# Patient Record
Sex: Female | Born: 1960 | Race: Black or African American | Hispanic: No | Marital: Married | Smoking: Never smoker
Health system: Southern US, Community
[De-identification: ages and names within clinical notes are randomized; demographics above are authoritative.]

## PROBLEM LIST (undated history)

## (undated) DIAGNOSIS — IMO0002 Reserved for concepts with insufficient information to code with codable children: Secondary | ICD-10-CM

## (undated) DIAGNOSIS — E785 Hyperlipidemia, unspecified: Secondary | ICD-10-CM

## (undated) DIAGNOSIS — I10 Essential (primary) hypertension: Secondary | ICD-10-CM

## (undated) DIAGNOSIS — E7849 Other hyperlipidemia: Secondary | ICD-10-CM

## (undated) DIAGNOSIS — D649 Anemia, unspecified: Secondary | ICD-10-CM

## (undated) DIAGNOSIS — G43909 Migraine, unspecified, not intractable, without status migrainosus: Secondary | ICD-10-CM

## (undated) DIAGNOSIS — E8881 Metabolic syndrome: Secondary | ICD-10-CM

## (undated) DIAGNOSIS — F609 Personality disorder, unspecified: Secondary | ICD-10-CM

## (undated) DIAGNOSIS — M1990 Unspecified osteoarthritis, unspecified site: Secondary | ICD-10-CM

## (undated) DIAGNOSIS — R251 Tremor, unspecified: Secondary | ICD-10-CM

## (undated) DIAGNOSIS — B009 Herpesviral infection, unspecified: Secondary | ICD-10-CM

## (undated) DIAGNOSIS — M797 Fibromyalgia: Secondary | ICD-10-CM

## (undated) DIAGNOSIS — Z1379 Encounter for other screening for genetic and chromosomal anomalies: Secondary | ICD-10-CM

## (undated) DIAGNOSIS — Z803 Family history of malignant neoplasm of breast: Secondary | ICD-10-CM

## (undated) DIAGNOSIS — Z026 Encounter for examination for insurance purposes: Secondary | ICD-10-CM

## (undated) DIAGNOSIS — K589 Irritable bowel syndrome without diarrhea: Secondary | ICD-10-CM

## (undated) DIAGNOSIS — F419 Anxiety disorder, unspecified: Secondary | ICD-10-CM

## (undated) HISTORY — DX: Unspecified osteoarthritis, unspecified site: M19.90

## (undated) HISTORY — DX: Anxiety disorder, unspecified: F41.9

## (undated) HISTORY — DX: Essential (primary) hypertension: I10

## (undated) HISTORY — DX: Anemia, unspecified: D64.9

## (undated) HISTORY — DX: Encounter for examination for insurance purposes: Z02.6

## (undated) HISTORY — DX: Metabolic syndrome: E88.810

## (undated) HISTORY — DX: Family history of malignant neoplasm of breast: Z80.3

## (undated) HISTORY — DX: Hyperlipidemia, unspecified: E78.5

## (undated) HISTORY — DX: Fibromyalgia: M79.7

## (undated) HISTORY — PX: OTHER SURGICAL HISTORY: SHX169

## (undated) HISTORY — DX: Encounter for other screening for genetic and chromosomal anomalies: Z13.79

## (undated) HISTORY — DX: Personality disorder, unspecified: F60.9

## (undated) HISTORY — DX: Migraine, unspecified, not intractable, without status migrainosus: G43.909

## (undated) HISTORY — PX: SHOULDER ARTHROSCOPY: SHX128

## (undated) HISTORY — DX: Irritable bowel syndrome, unspecified: K58.9

## (undated) HISTORY — DX: Metabolic syndrome: E88.81

## (undated) HISTORY — DX: Tremor, unspecified: R25.1

## (undated) HISTORY — DX: Reserved for concepts with insufficient information to code with codable children: IMO0002

## (undated) HISTORY — DX: Other hyperlipidemia: E78.49

## (undated) HISTORY — DX: Herpesviral infection, unspecified: B00.9

---

## 1994-09-16 HISTORY — PX: SHOULDER ARTHROSCOPY: SHX128

## 1998-05-31 ENCOUNTER — Inpatient Hospital Stay (HOSPITAL_COMMUNITY): Admission: RE | Admit: 1998-05-31 | Discharge: 1998-06-02 | Payer: Self-pay | Admitting: Gynecology

## 1998-11-07 ENCOUNTER — Other Ambulatory Visit: Admission: RE | Admit: 1998-11-07 | Discharge: 1998-11-07 | Payer: Self-pay | Admitting: Gynecology

## 1999-08-27 ENCOUNTER — Encounter: Admission: RE | Admit: 1999-08-27 | Discharge: 1999-08-27 | Payer: Self-pay | Admitting: Neurology

## 1999-08-27 ENCOUNTER — Encounter: Payer: Self-pay | Admitting: Neurology

## 1999-10-24 ENCOUNTER — Other Ambulatory Visit: Admission: RE | Admit: 1999-10-24 | Discharge: 1999-10-24 | Payer: Self-pay | Admitting: Gynecology

## 1999-12-06 ENCOUNTER — Ambulatory Visit (HOSPITAL_COMMUNITY): Admission: RE | Admit: 1999-12-06 | Discharge: 1999-12-06 | Payer: Self-pay | Admitting: Psychiatry

## 1999-12-10 ENCOUNTER — Ambulatory Visit (HOSPITAL_COMMUNITY): Admission: RE | Admit: 1999-12-10 | Discharge: 1999-12-10 | Payer: Self-pay | Admitting: Psychiatry

## 1999-12-17 ENCOUNTER — Ambulatory Visit (HOSPITAL_COMMUNITY): Admission: RE | Admit: 1999-12-17 | Discharge: 1999-12-17 | Payer: Self-pay | Admitting: Psychiatry

## 2000-01-16 ENCOUNTER — Ambulatory Visit (HOSPITAL_COMMUNITY): Admission: RE | Admit: 2000-01-16 | Discharge: 2000-01-16 | Payer: Self-pay | Admitting: Psychiatry

## 2000-01-28 ENCOUNTER — Ambulatory Visit (HOSPITAL_COMMUNITY): Admission: RE | Admit: 2000-01-28 | Discharge: 2000-01-28 | Payer: Self-pay | Admitting: Psychiatry

## 2000-02-05 ENCOUNTER — Ambulatory Visit (HOSPITAL_COMMUNITY): Admission: RE | Admit: 2000-02-05 | Discharge: 2000-02-05 | Payer: Self-pay | Admitting: Psychiatry

## 2000-02-12 ENCOUNTER — Ambulatory Visit (HOSPITAL_COMMUNITY): Admission: RE | Admit: 2000-02-12 | Discharge: 2000-02-12 | Payer: Self-pay | Admitting: Psychiatry

## 2000-02-18 ENCOUNTER — Ambulatory Visit (HOSPITAL_COMMUNITY): Admission: RE | Admit: 2000-02-18 | Discharge: 2000-02-18 | Payer: Self-pay | Admitting: Psychiatry

## 2000-02-25 ENCOUNTER — Inpatient Hospital Stay (HOSPITAL_COMMUNITY): Admission: AD | Admit: 2000-02-25 | Discharge: 2000-02-26 | Payer: Self-pay | Admitting: Psychiatry

## 2000-03-07 ENCOUNTER — Ambulatory Visit (HOSPITAL_COMMUNITY): Admission: RE | Admit: 2000-03-07 | Discharge: 2000-03-07 | Payer: Self-pay | Admitting: Psychiatry

## 2000-03-10 ENCOUNTER — Inpatient Hospital Stay (HOSPITAL_COMMUNITY): Admission: AD | Admit: 2000-03-10 | Discharge: 2000-03-15 | Payer: Self-pay | Admitting: *Deleted

## 2000-03-20 ENCOUNTER — Ambulatory Visit (HOSPITAL_COMMUNITY): Admission: RE | Admit: 2000-03-20 | Discharge: 2000-03-20 | Payer: Self-pay | Admitting: Psychiatry

## 2000-03-25 ENCOUNTER — Ambulatory Visit (HOSPITAL_COMMUNITY): Admission: RE | Admit: 2000-03-25 | Discharge: 2000-03-25 | Payer: Self-pay | Admitting: Psychiatry

## 2000-03-28 ENCOUNTER — Ambulatory Visit (HOSPITAL_COMMUNITY): Admission: RE | Admit: 2000-03-28 | Discharge: 2000-03-28 | Payer: Self-pay | Admitting: Psychiatry

## 2000-03-31 ENCOUNTER — Ambulatory Visit (HOSPITAL_COMMUNITY): Admission: RE | Admit: 2000-03-31 | Discharge: 2000-03-31 | Payer: Self-pay | Admitting: Psychiatry

## 2000-04-03 ENCOUNTER — Ambulatory Visit (HOSPITAL_COMMUNITY): Admission: RE | Admit: 2000-04-03 | Discharge: 2000-04-03 | Payer: Self-pay | Admitting: Psychiatry

## 2000-04-07 ENCOUNTER — Ambulatory Visit (HOSPITAL_COMMUNITY): Admission: RE | Admit: 2000-04-07 | Discharge: 2000-04-07 | Payer: Self-pay | Admitting: Psychiatry

## 2000-04-09 ENCOUNTER — Ambulatory Visit (HOSPITAL_COMMUNITY): Admission: RE | Admit: 2000-04-09 | Discharge: 2000-04-09 | Payer: Self-pay | Admitting: Psychiatry

## 2000-04-11 ENCOUNTER — Ambulatory Visit (HOSPITAL_COMMUNITY): Admission: RE | Admit: 2000-04-11 | Discharge: 2000-04-11 | Payer: Self-pay | Admitting: Psychiatry

## 2000-04-15 ENCOUNTER — Ambulatory Visit (HOSPITAL_COMMUNITY): Admission: RE | Admit: 2000-04-15 | Discharge: 2000-04-15 | Payer: Self-pay | Admitting: Psychiatry

## 2000-05-08 ENCOUNTER — Ambulatory Visit (HOSPITAL_COMMUNITY): Admission: RE | Admit: 2000-05-08 | Discharge: 2000-05-08 | Payer: Self-pay | Admitting: Psychiatry

## 2000-05-13 ENCOUNTER — Ambulatory Visit (HOSPITAL_COMMUNITY): Admission: RE | Admit: 2000-05-13 | Discharge: 2000-05-13 | Payer: Self-pay | Admitting: Psychiatry

## 2000-05-16 ENCOUNTER — Ambulatory Visit (HOSPITAL_COMMUNITY): Admission: RE | Admit: 2000-05-16 | Discharge: 2000-05-16 | Payer: Self-pay | Admitting: Psychiatry

## 2000-05-20 ENCOUNTER — Ambulatory Visit (HOSPITAL_COMMUNITY): Admission: RE | Admit: 2000-05-20 | Discharge: 2000-05-20 | Payer: Self-pay | Admitting: Psychiatry

## 2000-05-22 ENCOUNTER — Ambulatory Visit (HOSPITAL_COMMUNITY): Admission: RE | Admit: 2000-05-22 | Discharge: 2000-05-22 | Payer: Self-pay | Admitting: Psychiatry

## 2000-06-03 ENCOUNTER — Ambulatory Visit (HOSPITAL_COMMUNITY): Admission: RE | Admit: 2000-06-03 | Discharge: 2000-06-03 | Payer: Self-pay | Admitting: Psychiatry

## 2000-06-06 ENCOUNTER — Ambulatory Visit (HOSPITAL_COMMUNITY): Admission: RE | Admit: 2000-06-06 | Discharge: 2000-06-06 | Payer: Self-pay | Admitting: Psychiatry

## 2000-06-10 ENCOUNTER — Ambulatory Visit (HOSPITAL_COMMUNITY): Admission: RE | Admit: 2000-06-10 | Discharge: 2000-06-10 | Payer: Self-pay | Admitting: Psychiatry

## 2000-06-25 ENCOUNTER — Ambulatory Visit (HOSPITAL_COMMUNITY): Admission: RE | Admit: 2000-06-25 | Discharge: 2000-06-25 | Payer: Self-pay | Admitting: Psychiatry

## 2000-07-01 ENCOUNTER — Ambulatory Visit (HOSPITAL_COMMUNITY): Admission: RE | Admit: 2000-07-01 | Discharge: 2000-07-01 | Payer: Self-pay | Admitting: Psychiatry

## 2000-07-03 ENCOUNTER — Ambulatory Visit (HOSPITAL_COMMUNITY): Admission: RE | Admit: 2000-07-03 | Discharge: 2000-07-03 | Payer: Self-pay | Admitting: Psychiatry

## 2000-07-08 ENCOUNTER — Ambulatory Visit (HOSPITAL_COMMUNITY): Admission: RE | Admit: 2000-07-08 | Discharge: 2000-07-08 | Payer: Self-pay | Admitting: Psychiatry

## 2000-07-10 ENCOUNTER — Ambulatory Visit (HOSPITAL_COMMUNITY): Admission: RE | Admit: 2000-07-10 | Discharge: 2000-07-10 | Payer: Self-pay | Admitting: Psychiatry

## 2000-07-15 ENCOUNTER — Ambulatory Visit (HOSPITAL_COMMUNITY): Admission: RE | Admit: 2000-07-15 | Discharge: 2000-07-15 | Payer: Self-pay | Admitting: Psychiatry

## 2000-07-17 ENCOUNTER — Ambulatory Visit (HOSPITAL_COMMUNITY): Admission: RE | Admit: 2000-07-17 | Discharge: 2000-07-17 | Payer: Self-pay | Admitting: Psychiatry

## 2000-07-21 ENCOUNTER — Ambulatory Visit (HOSPITAL_COMMUNITY): Admission: RE | Admit: 2000-07-21 | Discharge: 2000-07-21 | Payer: Self-pay | Admitting: Psychiatry

## 2000-07-24 ENCOUNTER — Ambulatory Visit (HOSPITAL_COMMUNITY): Admission: RE | Admit: 2000-07-24 | Discharge: 2000-07-24 | Payer: Self-pay | Admitting: Psychiatry

## 2000-08-04 ENCOUNTER — Ambulatory Visit (HOSPITAL_COMMUNITY): Admission: RE | Admit: 2000-08-04 | Discharge: 2000-08-04 | Payer: Self-pay | Admitting: Psychiatry

## 2000-08-11 ENCOUNTER — Ambulatory Visit (HOSPITAL_COMMUNITY): Admission: RE | Admit: 2000-08-11 | Discharge: 2000-08-11 | Payer: Self-pay | Admitting: Psychiatry

## 2000-08-27 ENCOUNTER — Encounter: Payer: Self-pay | Admitting: Family Medicine

## 2000-08-27 ENCOUNTER — Encounter: Admission: RE | Admit: 2000-08-27 | Discharge: 2000-08-27 | Payer: Self-pay | Admitting: Family Medicine

## 2000-09-02 ENCOUNTER — Ambulatory Visit (HOSPITAL_COMMUNITY): Admission: RE | Admit: 2000-09-02 | Discharge: 2000-09-02 | Payer: Self-pay | Admitting: Psychiatry

## 2000-09-04 ENCOUNTER — Ambulatory Visit (HOSPITAL_COMMUNITY): Admission: RE | Admit: 2000-09-04 | Discharge: 2000-09-04 | Payer: Self-pay | Admitting: Psychiatry

## 2000-09-04 ENCOUNTER — Encounter: Admission: RE | Admit: 2000-09-04 | Discharge: 2000-09-04 | Payer: Self-pay | Admitting: Psychiatry

## 2000-09-12 ENCOUNTER — Ambulatory Visit (HOSPITAL_COMMUNITY): Admission: RE | Admit: 2000-09-12 | Discharge: 2000-09-12 | Payer: Self-pay | Admitting: Psychiatry

## 2000-09-16 HISTORY — PX: BREAST BIOPSY: SHX20

## 2000-09-17 ENCOUNTER — Ambulatory Visit (HOSPITAL_COMMUNITY): Admission: RE | Admit: 2000-09-17 | Discharge: 2000-09-17 | Payer: Self-pay | Admitting: Psychiatry

## 2000-10-09 ENCOUNTER — Encounter: Admission: RE | Admit: 2000-10-09 | Discharge: 2000-10-09 | Payer: Self-pay | Admitting: Psychiatry

## 2000-10-09 ENCOUNTER — Ambulatory Visit (HOSPITAL_COMMUNITY): Admission: RE | Admit: 2000-10-09 | Discharge: 2000-10-09 | Payer: Self-pay | Admitting: Psychiatry

## 2000-10-14 ENCOUNTER — Ambulatory Visit (HOSPITAL_COMMUNITY): Admission: RE | Admit: 2000-10-14 | Discharge: 2000-10-14 | Payer: Self-pay | Admitting: Psychiatry

## 2000-10-14 ENCOUNTER — Encounter: Admission: RE | Admit: 2000-10-14 | Discharge: 2000-10-14 | Payer: Self-pay | Admitting: Psychiatry

## 2000-10-27 ENCOUNTER — Other Ambulatory Visit: Admission: RE | Admit: 2000-10-27 | Discharge: 2000-10-27 | Payer: Self-pay | Admitting: Gynecology

## 2000-11-13 ENCOUNTER — Encounter: Payer: Self-pay | Admitting: Gynecology

## 2000-11-13 ENCOUNTER — Ambulatory Visit (HOSPITAL_COMMUNITY): Admission: RE | Admit: 2000-11-13 | Discharge: 2000-11-13 | Payer: Self-pay | Admitting: Gynecology

## 2001-03-24 ENCOUNTER — Other Ambulatory Visit (HOSPITAL_COMMUNITY): Admission: RE | Admit: 2001-03-24 | Discharge: 2001-03-31 | Payer: Self-pay | Admitting: Psychiatry

## 2001-04-09 ENCOUNTER — Encounter: Admission: RE | Admit: 2001-04-09 | Discharge: 2001-04-09 | Payer: Self-pay | Admitting: Gynecology

## 2001-04-09 ENCOUNTER — Encounter: Payer: Self-pay | Admitting: Gynecology

## 2001-05-05 ENCOUNTER — Ambulatory Visit (HOSPITAL_COMMUNITY): Admission: RE | Admit: 2001-05-05 | Discharge: 2001-05-05 | Payer: Self-pay | Admitting: Psychiatry

## 2001-05-15 ENCOUNTER — Encounter: Admission: RE | Admit: 2001-05-15 | Discharge: 2001-05-15 | Payer: Self-pay | Admitting: Orthopedic Surgery

## 2001-05-15 ENCOUNTER — Encounter: Payer: Self-pay | Admitting: Orthopedic Surgery

## 2001-05-20 ENCOUNTER — Other Ambulatory Visit (HOSPITAL_COMMUNITY): Admission: RE | Admit: 2001-05-20 | Discharge: 2001-05-29 | Payer: Self-pay | Admitting: Psychiatry

## 2001-06-16 ENCOUNTER — Encounter: Admission: RE | Admit: 2001-06-16 | Discharge: 2001-09-14 | Payer: Self-pay | Admitting: Specialist

## 2001-06-24 ENCOUNTER — Encounter (INDEPENDENT_AMBULATORY_CARE_PROVIDER_SITE_OTHER): Payer: Self-pay | Admitting: *Deleted

## 2001-06-24 ENCOUNTER — Ambulatory Visit (HOSPITAL_BASED_OUTPATIENT_CLINIC_OR_DEPARTMENT_OTHER): Admission: RE | Admit: 2001-06-24 | Discharge: 2001-06-24 | Payer: Self-pay | Admitting: Surgery

## 2001-07-08 ENCOUNTER — Encounter: Admission: RE | Admit: 2001-07-08 | Discharge: 2001-07-08 | Payer: Self-pay | Admitting: Psychiatry

## 2001-07-10 ENCOUNTER — Encounter: Admission: RE | Admit: 2001-07-10 | Discharge: 2001-07-10 | Payer: Self-pay | Admitting: Psychiatry

## 2001-07-16 ENCOUNTER — Encounter: Admission: RE | Admit: 2001-07-16 | Discharge: 2001-07-16 | Payer: Self-pay | Admitting: Psychiatry

## 2001-07-20 ENCOUNTER — Encounter: Admission: RE | Admit: 2001-07-20 | Discharge: 2001-07-20 | Payer: Self-pay | Admitting: Psychiatry

## 2001-07-23 ENCOUNTER — Encounter: Admission: RE | Admit: 2001-07-23 | Discharge: 2001-07-23 | Payer: Self-pay | Admitting: Psychiatry

## 2001-07-27 ENCOUNTER — Encounter: Admission: RE | Admit: 2001-07-27 | Discharge: 2001-07-27 | Payer: Self-pay | Admitting: Psychiatry

## 2001-07-29 ENCOUNTER — Encounter: Admission: RE | Admit: 2001-07-29 | Discharge: 2001-07-29 | Payer: Self-pay | Admitting: Psychiatry

## 2001-08-03 ENCOUNTER — Encounter: Admission: RE | Admit: 2001-08-03 | Discharge: 2001-08-03 | Payer: Self-pay | Admitting: Psychiatry

## 2001-08-07 ENCOUNTER — Encounter: Admission: RE | Admit: 2001-08-07 | Discharge: 2001-08-07 | Payer: Self-pay | Admitting: Psychiatry

## 2001-08-10 ENCOUNTER — Encounter: Admission: RE | Admit: 2001-08-10 | Discharge: 2001-08-10 | Payer: Self-pay | Admitting: Psychiatry

## 2001-08-17 ENCOUNTER — Encounter: Admission: RE | Admit: 2001-08-17 | Discharge: 2001-08-17 | Payer: Self-pay | Admitting: Psychiatry

## 2001-08-24 ENCOUNTER — Encounter: Admission: RE | Admit: 2001-08-24 | Discharge: 2001-08-24 | Payer: Self-pay | Admitting: Psychiatry

## 2001-08-27 ENCOUNTER — Encounter: Admission: RE | Admit: 2001-08-27 | Discharge: 2001-08-27 | Payer: Self-pay | Admitting: Psychiatry

## 2001-08-31 ENCOUNTER — Encounter: Admission: RE | Admit: 2001-08-31 | Discharge: 2001-08-31 | Payer: Self-pay | Admitting: Psychiatry

## 2001-09-07 ENCOUNTER — Encounter: Admission: RE | Admit: 2001-09-07 | Discharge: 2001-09-07 | Payer: Self-pay | Admitting: Psychiatry

## 2001-09-18 ENCOUNTER — Encounter: Admission: RE | Admit: 2001-09-18 | Discharge: 2001-09-18 | Payer: Self-pay | Admitting: Psychiatry

## 2001-09-28 ENCOUNTER — Encounter: Admission: RE | Admit: 2001-09-28 | Discharge: 2001-09-28 | Payer: Self-pay | Admitting: Psychiatry

## 2001-10-01 ENCOUNTER — Encounter: Admission: RE | Admit: 2001-10-01 | Discharge: 2001-10-01 | Payer: Self-pay | Admitting: Psychiatry

## 2001-10-05 ENCOUNTER — Encounter: Admission: RE | Admit: 2001-10-05 | Discharge: 2001-10-05 | Payer: Self-pay | Admitting: Psychiatry

## 2001-10-12 ENCOUNTER — Encounter: Admission: RE | Admit: 2001-10-12 | Discharge: 2001-10-12 | Payer: Self-pay | Admitting: Psychiatry

## 2001-10-15 ENCOUNTER — Encounter: Admission: RE | Admit: 2001-10-15 | Discharge: 2001-10-15 | Payer: Self-pay | Admitting: Psychiatry

## 2001-10-19 ENCOUNTER — Encounter: Admission: RE | Admit: 2001-10-19 | Discharge: 2001-10-19 | Payer: Self-pay | Admitting: Psychiatry

## 2001-10-21 ENCOUNTER — Encounter: Admission: RE | Admit: 2001-10-21 | Discharge: 2001-10-21 | Payer: Self-pay | Admitting: Psychiatry

## 2001-10-22 ENCOUNTER — Other Ambulatory Visit (HOSPITAL_COMMUNITY): Admission: RE | Admit: 2001-10-22 | Discharge: 2001-11-05 | Payer: Self-pay | Admitting: Psychiatry

## 2001-10-29 ENCOUNTER — Other Ambulatory Visit: Admission: RE | Admit: 2001-10-29 | Discharge: 2001-10-29 | Payer: Self-pay | Admitting: Gynecology

## 2001-11-09 ENCOUNTER — Encounter: Admission: RE | Admit: 2001-11-09 | Discharge: 2001-11-09 | Payer: Self-pay | Admitting: Psychiatry

## 2001-11-16 ENCOUNTER — Encounter: Admission: RE | Admit: 2001-11-16 | Discharge: 2001-11-16 | Payer: Self-pay | Admitting: Psychiatry

## 2002-04-12 ENCOUNTER — Encounter: Admission: RE | Admit: 2002-04-12 | Discharge: 2002-04-12 | Payer: Self-pay | Admitting: Surgery

## 2002-04-12 ENCOUNTER — Encounter: Payer: Self-pay | Admitting: Surgery

## 2002-09-16 HISTORY — PX: BREAST EXCISIONAL BIOPSY: SUR124

## 2002-11-01 ENCOUNTER — Other Ambulatory Visit: Admission: RE | Admit: 2002-11-01 | Discharge: 2002-11-01 | Payer: Self-pay | Admitting: Gynecology

## 2003-04-14 ENCOUNTER — Encounter: Admission: RE | Admit: 2003-04-14 | Discharge: 2003-04-14 | Payer: Self-pay | Admitting: Surgery

## 2003-04-14 ENCOUNTER — Encounter: Payer: Self-pay | Admitting: Surgery

## 2003-11-16 ENCOUNTER — Other Ambulatory Visit: Admission: RE | Admit: 2003-11-16 | Discharge: 2003-11-16 | Payer: Self-pay | Admitting: Gynecology

## 2004-04-17 ENCOUNTER — Encounter: Admission: RE | Admit: 2004-04-17 | Discharge: 2004-04-17 | Payer: Self-pay | Admitting: Surgery

## 2004-11-21 ENCOUNTER — Other Ambulatory Visit: Admission: RE | Admit: 2004-11-21 | Discharge: 2004-11-21 | Payer: Self-pay | Admitting: Gynecology

## 2004-11-28 ENCOUNTER — Encounter: Admission: RE | Admit: 2004-11-28 | Discharge: 2004-11-28 | Payer: Self-pay | Admitting: Gynecology

## 2004-12-18 ENCOUNTER — Encounter: Admission: RE | Admit: 2004-12-18 | Discharge: 2004-12-18 | Payer: Self-pay | Admitting: Orthopedic Surgery

## 2005-04-19 ENCOUNTER — Encounter: Admission: RE | Admit: 2005-04-19 | Discharge: 2005-04-19 | Payer: Self-pay | Admitting: Surgery

## 2005-11-26 ENCOUNTER — Other Ambulatory Visit: Admission: RE | Admit: 2005-11-26 | Discharge: 2005-11-26 | Payer: Self-pay | Admitting: Gynecology

## 2006-02-27 ENCOUNTER — Encounter: Admission: RE | Admit: 2006-02-27 | Discharge: 2006-02-27 | Payer: Self-pay | Admitting: Orthopedic Surgery

## 2006-03-18 ENCOUNTER — Encounter: Admission: RE | Admit: 2006-03-18 | Discharge: 2006-03-18 | Payer: Self-pay | Admitting: Orthopedic Surgery

## 2006-05-14 ENCOUNTER — Encounter: Admission: RE | Admit: 2006-05-14 | Discharge: 2006-05-14 | Payer: Self-pay | Admitting: Orthopedic Surgery

## 2006-08-01 ENCOUNTER — Ambulatory Visit (HOSPITAL_COMMUNITY): Admission: RE | Admit: 2006-08-01 | Discharge: 2006-08-01 | Payer: Self-pay | Admitting: Surgery

## 2007-01-14 ENCOUNTER — Other Ambulatory Visit: Admission: RE | Admit: 2007-01-14 | Discharge: 2007-01-14 | Payer: Self-pay | Admitting: Gynecology

## 2007-08-04 ENCOUNTER — Ambulatory Visit (HOSPITAL_COMMUNITY): Admission: RE | Admit: 2007-08-04 | Discharge: 2007-08-04 | Payer: Self-pay | Admitting: Gynecology

## 2007-12-14 ENCOUNTER — Encounter: Admission: RE | Admit: 2007-12-14 | Discharge: 2007-12-14 | Payer: Self-pay | Admitting: Surgery

## 2008-08-04 ENCOUNTER — Ambulatory Visit (HOSPITAL_COMMUNITY): Admission: RE | Admit: 2008-08-04 | Discharge: 2008-08-04 | Payer: Self-pay | Admitting: Surgery

## 2009-01-31 ENCOUNTER — Encounter: Admission: RE | Admit: 2009-01-31 | Discharge: 2009-01-31 | Payer: Self-pay | Admitting: Orthopedic Surgery

## 2009-06-19 ENCOUNTER — Encounter: Admission: RE | Admit: 2009-06-19 | Discharge: 2009-06-19 | Payer: Self-pay | Admitting: Orthopedic Surgery

## 2009-08-08 ENCOUNTER — Ambulatory Visit (HOSPITAL_COMMUNITY): Admission: RE | Admit: 2009-08-08 | Discharge: 2009-08-08 | Payer: Self-pay | Admitting: Gynecology

## 2009-08-17 ENCOUNTER — Encounter: Admission: RE | Admit: 2009-08-17 | Discharge: 2009-08-17 | Payer: Self-pay | Admitting: Gynecology

## 2009-12-15 HISTORY — PX: BACK SURGERY: SHX140

## 2009-12-26 ENCOUNTER — Ambulatory Visit (HOSPITAL_COMMUNITY): Admission: RE | Admit: 2009-12-26 | Discharge: 2009-12-27 | Payer: Self-pay | Admitting: Neurosurgery

## 2010-01-30 ENCOUNTER — Encounter: Admission: RE | Admit: 2010-01-30 | Discharge: 2010-01-30 | Payer: Self-pay | Admitting: Surgery

## 2010-08-13 ENCOUNTER — Ambulatory Visit (HOSPITAL_COMMUNITY): Admission: RE | Admit: 2010-08-13 | Discharge: 2010-08-13 | Payer: Self-pay | Admitting: Surgery

## 2010-10-07 ENCOUNTER — Encounter: Payer: Self-pay | Admitting: Surgery

## 2010-10-07 ENCOUNTER — Encounter: Payer: Self-pay | Admitting: Gynecology

## 2010-12-05 LAB — URINALYSIS, ROUTINE W REFLEX MICROSCOPIC
Bilirubin Urine: NEGATIVE
Glucose, UA: NEGATIVE mg/dL
Hgb urine dipstick: NEGATIVE
Ketones, ur: NEGATIVE mg/dL
Nitrite: NEGATIVE
Protein, ur: NEGATIVE mg/dL
Specific Gravity, Urine: 1.025 (ref 1.005–1.030)
Urobilinogen, UA: 0.2 mg/dL (ref 0.0–1.0)
pH: 6.5 (ref 5.0–8.0)

## 2010-12-05 LAB — BASIC METABOLIC PANEL
BUN: 14 mg/dL (ref 6–23)
CO2: 25 mEq/L (ref 19–32)
Calcium: 9.1 mg/dL (ref 8.4–10.5)
Chloride: 107 mEq/L (ref 96–112)
Creatinine, Ser: 0.84 mg/dL (ref 0.4–1.2)
GFR calc Af Amer: >60 mL/min (ref >60)
GFR calc non Af Amer: >60 mL/min (ref >60)
Glucose, Bld: 96 mg/dL (ref 70–99)
Potassium: 3.5 mEq/L (ref 3.5–5.1)
Sodium: 136 mEq/L (ref 135–145)

## 2010-12-05 LAB — CBC
HCT: 38.2 % (ref 36.0–46.0)
Hemoglobin: 12.6 g/dL (ref 12.0–15.0)
MCHC: 32.9 g/dL (ref 30.0–36.0)
MCV: 90.3 fL (ref 78.0–100.0)
Platelets: 286 10*3/uL (ref 150–400)
RBC: 4.23 MIL/uL (ref 3.87–5.11)
RDW: 13.2 % (ref 11.5–15.5)
WBC: 7 10*3/uL (ref 4.0–10.5)

## 2010-12-05 LAB — PROTIME-INR
INR: 1 (ref 0.00–1.49)
Prothrombin Time: 13.1 seconds (ref 11.6–15.2)

## 2010-12-05 LAB — APTT: aPTT: 29 seconds (ref 24–37)

## 2010-12-05 LAB — SURGICAL PCR SCREEN
MRSA, PCR: NEGATIVE
Staphylococcus aureus: POSITIVE — AB

## 2010-12-05 LAB — PREGNANCY, URINE: Preg Test, Ur: NEGATIVE
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
## 2011-02-01 NOTE — Discharge Summary (Signed)
[facility]  Patient:    Dana Nichols, Dana Nichols                MRN: [id number] Adm. Date:  [id number] Disc. Date: 02/26/00 Attending:  Otilio Saber Dictator:   Eduard Roux, NP                           Discharge Summary  IDENTIFYING DATA:  Dana Nichols is married African-American female voluntarily admitted secondary to depression with suicidal ideation as a consequence to ongoing chronic pain disorder.  HISTORY OF PRESENT ILLNESS:  Patient currently is an outpatient client of Dana Nichols and has been treated there for depression secondary to ongoing pain condition related to an on-the-job, work-related injury.  She states she has been having ongoing conflicts with [employer].  She has had trouble getting her medication approved and she states that her pain has been increasing.  She has felt progressively like she is a burden on her family and feels also like she is doomed and will have to eventually kill herself in order to escape the pain.  She is very hopeless and helpless.  She is contracting for safety on the unit.  She still is verbalizing some suicidal ideation.  PAST PSYCHIATRIC HISTORY:  She is a Radio producer. She has had no previous inpatient treatment.  She has seen outpatient therapists in the past, Dana Nichols and Dana Nichols, and is recently a client of Dana Nichols, who saw her on February 25, 2000, where she expressed suicidal ideation and it was felt she should come inpatient for stabilization.  She is currently on Paxil 20 mg q.d. and Trazodone 50-100 mg q.h.s. p.r.n. for insomnia.  Patient sees Dr. Madelon Lips as her orthopedic surgeon and Dr. Marcy Panning as her primary care Dana Nichols, a Dr. Honor Nichols performs acupuncture on her, a Dr. Clifton Nichols is her orthopedist, and Dr. Sandria Nichols is her neurologist.  Current medical problems include right rotator cuff injury with  three subsequent surgical interventions that have left her with a right flacid hand with a gross tremor.  She also had a hysterectomy in 1999. She has a history of hypertension.  Current medications:  Paxil 20 mg q.d., Soma 1 tablet t.i.d. 350 mg, Caffrey:  Vioxx 25 mg q.d., OxyContin 10 mg t.i.d., Norvasc 5 mg q.d., Lamictal 25 mg q.h.s., Neurontin 300 mg t.i.d. and orphenadrine 100 mg twice a day.  DRUG ALLERGIES:  CODEINE.  ADMITTING MEDICATIONS:  Patient was resumed on her home medicines of Soma 350 mg t.i.d., Norvasc 5 mg q.d., Orphenadrine 100 mg t.i.d., Vioxx 25 mg q.d., Paxil 20 mg q.d., OxyContin 10 mg t.i.d., Neurontin 300 mg t.i.d., Trazodone 150 mg q.h.s.  MENTAL STATUS EXAMINATION:  Patient is a casually dressed African-American female.  She is very cooperative and pleasant.  Her speech is normal rate and tone and relevant.  Mood is depressed, affect is somewhat anxious.  Her thought processes are positive for suicidal ideation.  She contracts for safety.  She is negative for homicidal ideation.  Her thoughts are coherent and logical.  There is no evidence of psychoses.  Her cognitive function is intact, she is alert and oriented x 4 with limited insight and judgment.  ADMITTING DIAGNOSES: Axis I:    Major depression, recurrent, severe. Axis II:   Deferred. Axis III:  Hypertension, chronic pain secondary to right shoulder injury. Axis IV:   Severe, related  to medical problems and chronic pain. Axis V:    Current global assessment of function 40, highest past year 70.  LABORATORY DATA:  No laboratory data was performed.  HOSPITAL COURSE:  The patient was admitted on a voluntary basis to [facility] for stabilization of her depression.  She was resumed on her home medications of Soma 350 mg t.i.d., Norvasc 5 mg q.d., orphenadrine 100 mg b.i.d., Vioxx 25 mg q.d., Paxil 20 mg q.d., OxyContin 10 mg t.i.d., Neurontin 300 mg t.i.d. and Trazodone 150 mg.   Patient continued to express some suicidal ideation but only as it related to her chronic pain.  It was felt in conjunction with patient that addressing her chronic pain would be the first step for stabilization for her.  Case management assisted in locating an inpatient chronic pain treatment, and ultimately orders were written to transfer to [facility].  It was felt that inpatient pain management would be appropriate and would help to insure Dana Nichols safety.  Ms. Gilles was in agreement to this plan.  CONDITION AT TRANSFER:  Patient was transferred to [facility].  She was contracting for safety.  Her mood was somewhat better as she felt that there might be some hope regarding her chronic pain.  She was denying any active suicidal impulses. As noted in the HPI, she was not homicidal and there was never any evidence of psychotic symptoms.  DISPOSITION:  Patient was transferred to [facility] on February 26, 2000.  FOLLOW UP:  Patient will follow up with [facility].  DISCHARGE MEDICATIONS:  Patient was discharged to [facility] on current medication list of Soma 350 mg t.i.d., Norvasc 5 mg q.d., orphenadrine 100 mg b.i.d., Vioxx 25 mg q.d., Paxil 20 mg q.d., OxyContin 10 mg t.i.d., Neurontin 300 mg t.i.d., Trazodone 150 mg q.h.s.  FINAL DIAGNOSIS: Axis I:    Major depression, recurrent, severe. Axis II:   Deferred. Axis II:   Hypertension, chronic pain secondary to right shoulder injury. Axis IV:   Severe, related to medical problems. Axis V:    Current global assessment of function is 45, highest past year 70. DD:  03/04/00 TD:  03/05/00 Job: [id number]
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
## 2011-02-01 NOTE — Op Note (Signed)
[place]  Patient:    Dana Nichols, Dana Nichols Visit Number: [id number] MRN: [id number]          Service Type: DSU Location: [facility] Attending Physician:  Charlton Haws Dictated by:   Currie Paris, M.D. Proc. Date: 06/24/01 Admit Date:  06/24/2001   CC:         Desma Maxim, M.D.  Gretta Cool, M.D.   Operative Report  CCS# [id number]  PREOPERATIVE DIAGNOSIS:  Right breast mass, probable chronic abscess or infected cyst.  POSTOPERATIVE DIAGNOSIS:  Right breast mass, probable chronic abscess or infected cyst.  OPERATION PERFORMED:  SURGEON:  Currie Paris, M.D.  ASSISTANT:  ANESTHESIA:  General LMA.  INDICATIONS FOR PROCEDURE:  The patient is a 50 year old woman with a mass in the right breast which has appeared to be somewhat infected, perhaps a chronic subareolar abscess versus a cyst that had become infected.  She had been treated with antibiotics but had a residual mass although not particularly tender was fairly discrete.  DESCRIPTION OF PROCEDURE:  The patient was brought to the operating room and after satisfactory general anesthesia had been obtained, the patient was prepped and draped.  I made a curvilinear incision along the medial aspect of the areolar margin and the mass was palpable readily just beneath the skin.  I started coming around it, but entered into it, got some purulent material out which was cultured.  I went a little further out and lateral and a little further around it and then excised it from its medial connections and superiorly then inferiorly and rotated it up and out of the breast itself and then finally disconnected it from the base of the nipple where it seemed to originate.  Once this was done, bleeders were electrocoagulated.  The area was infiltrated with 1% Xylocaine to help with postoperative analgesia.  The skin was closed loosely with some 4-0 Monocryl and then Penrose  drain placed. Sterile dressings were applied. The patient tolerated the procedure well. There were no operative complications.  All counts were correct. Dictated by:   Currie Paris, M.D. Attending Physician:  Charlton Haws DD:  06/24/01 TD:  06/24/01 Job: [phone]
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
## 2011-07-09 ENCOUNTER — Other Ambulatory Visit (HOSPITAL_COMMUNITY): Payer: Self-pay | Admitting: Gynecology

## 2011-07-09 DIAGNOSIS — Z1231 Encounter for screening mammogram for malignant neoplasm of breast: Secondary | ICD-10-CM

## 2011-08-15 ENCOUNTER — Ambulatory Visit (HOSPITAL_COMMUNITY)
Admission: RE | Admit: 2011-08-15 | Discharge: 2011-08-15 | Disposition: A | Discharge status: Home / Self Care | Payer: Federal, State, Local not specified - PPO | Source: Ambulatory Visit | Attending: Gynecology | Admitting: Gynecology

## 2011-08-15 DIAGNOSIS — Z1231 Encounter for screening mammogram for malignant neoplasm of breast: Secondary | ICD-10-CM | POA: Insufficient documentation

## 2011-09-19 ENCOUNTER — Other Ambulatory Visit: Payer: Self-pay | Admitting: Gynecology

## 2012-04-21 ENCOUNTER — Encounter (INDEPENDENT_AMBULATORY_CARE_PROVIDER_SITE_OTHER): Payer: Self-pay | Admitting: Surgery

## 2012-04-21 ENCOUNTER — Ambulatory Visit (INDEPENDENT_AMBULATORY_CARE_PROVIDER_SITE_OTHER): Payer: Federal, State, Local not specified - PPO | Admitting: Surgery

## 2012-04-21 VITALS — BP 124/84 | HR 96 | Temp 97.0°F | Resp 16 | Ht 66.0 in | Wt 192.0 lb

## 2012-04-21 DIAGNOSIS — N6019 Diffuse cystic mastopathy of unspecified breast: Secondary | ICD-10-CM

## 2012-04-21 DIAGNOSIS — N63 Unspecified lump in unspecified breast: Secondary | ICD-10-CM

## 2012-04-21 NOTE — Patient Instructions (Addendum)
We will arrange a new mammogram and ultrasound to evaluate the areas in each breast that feel like a tender lump to you

## 2012-04-21 NOTE — Progress Notes (Signed)
Dana Nichols DOB: 16-Jun-1961 MRN: [id number]                                                                                      DATE: 04/21/2012  PCP: Lupita Raider, MD Referring Provider: Gretta Cool, MD  IMPRESSION:  Bilateral fibrocystic changes with nodularity upper outer quadrants that are tender. No definite dominant mass. History of dense breast tissue on prior mammograms and cysts on previous aspirations.  PLAN:  Will obtain fresh mammogram and ultrasound and then make decisions.                 CC:  Chief Complaint  Patient presents with  . Breast Mass    bilateral    HPI:  Dana Nichols is a 51 y.o.  female who presents for evaluation of Bilateral breast masses. She has noted these over the last month and they're tender. She notes only when she is sitting up or leaning forward and can feel it when she's lying down. She points to the right upper-outer quadrant and left upper outer quadrant as the areas. I saw him back in 2011 with dominant cysts. Those apparently resolved. Her last mammogram was in November and was negative although dense breast tissue is noted.  PMH:  has a past medical history of Anemia; Arthritis; Fibromyalgia; and Hypertension.  PSH:   has past surgical history that includes Shoulder arthroscopy (05/22/95) and Back surgery (12/2009).  ALLERGIES:   Allergies  Allergen Reactions  . Codeine     Not even sure - mother told pt she was allergic     MEDICATIONS: Current outpatient prescriptions:amLODipine (NORVASC) 10 MG tablet, Take 10 mg by mouth Daily., Disp: , Rfl: ;  celecoxib (CELEBREX) 200 MG capsule, Take 200 mg by mouth 2 (two) times daily., Disp: , Rfl: ;  DULoxetine (CYMBALTA) 30 MG capsule, Take 30 mg by mouth at bedtime and may repeat dose one time if needed., Disp: , Rfl: ;  fluticasone (CUTIVATE) 0.05 % cream, Apply 0.05 % topically as needed., Disp: , Rfl:  gabapentin (NEURONTIN) 600 MG tablet, Take 600 mg by mouth 3 (three)  times daily., Disp: , Rfl: ;  topiramate (TOPAMAX) 25 MG tablet, Take 25 mg by mouth 3 (three) times daily., Disp: , Rfl: ;  valACYclovir (VALTREX) 1000 MG tablet, Take 1 g by mouth 2 (two) times daily., Disp: , Rfl:   ROS: She has filled out our 12 point review of systems and it is negative Except for problems chills sore throat high discomfort leg swelling recent constipation nausea arthritis pains headaches fainting weakness and rash.Marland Kitchen EXAM:   Vital signs:BP 124/84  Pulse 96  Temp 97 F (36.1 C) (Temporal)  Resp 16  Ht 5\' 6"  (1.676 m)  Wt 192 lb (87.091 kg)  BMI 30.99 kg/m2 Gen.: Patient alert oriented healthy-appearing, NAD. Breasts: These are symmetric. She is quite tender especially upper outer quadrant bilaterally but also centrally. There is some nodularity consistent with fibrocystic change noted in the upper outer quadrants more prominently. I don't appreciate a definite dominant mass but she is tender and somewhat difficult to examine. The skin nipple and areolar  areas are normal to inspection. Lymphatics: No axillary lymphadenopathy noted Extremities:She has a limited range of motion of the right shoulder as well as a very prominent tremor of the right hand  DATA REVIEWED:  Mild office notes were reviewed as well as the mammogram and ultrasound reports from the past.    Nekayla Heider J 04/21/2012  CC: Gretta Cool, MD, Lupita Raider, MD

## 2012-04-27 ENCOUNTER — Ambulatory Visit
Admission: RE | Admit: 2012-04-27 | Discharge: 2012-04-27 | Disposition: A | Discharge status: Home / Self Care | Payer: Federal, State, Local not specified - PPO | Source: Ambulatory Visit | Attending: Surgery | Admitting: Surgery

## 2012-04-27 ENCOUNTER — Telehealth (INDEPENDENT_AMBULATORY_CARE_PROVIDER_SITE_OTHER): Payer: Self-pay | Admitting: General Surgery

## 2012-04-27 DIAGNOSIS — N63 Unspecified lump in unspecified breast: Secondary | ICD-10-CM

## 2012-04-27 DIAGNOSIS — N6019 Diffuse cystic mastopathy of unspecified breast: Secondary | ICD-10-CM

## 2012-04-27 NOTE — Telephone Encounter (Signed)
Patient aware mammogram/ultrasound benign. She will call as needed.

## 2012-04-27 NOTE — Telephone Encounter (Signed)
Message copied by Liliana Cline on Mon Apr 27, 2012 10:39 AM ------      Message from: Currie Paris      Created: Mon Apr 27, 2012 10:09 AM       Let her know that there is nothing suspicious in either breast, just cysts

## 2012-06-02 HISTORY — PX: NM MYOVIEW LTD: HXRAD82

## 2012-08-19 ENCOUNTER — Telehealth (INDEPENDENT_AMBULATORY_CARE_PROVIDER_SITE_OTHER): Payer: Self-pay | Admitting: General Surgery

## 2012-08-19 NOTE — Telephone Encounter (Signed)
Pt was seen in August for breast pain and had mammogram an u/s/ normal per pt/ She has been experiencing increased pain in outer quadrant of both breasts for several days, aleve not helping. She would like advice for pain management please/gy/ [phone]

## 2012-08-20 NOTE — Telephone Encounter (Signed)
If she is still having pain, would be best that she be re-examined before making any decisions

## 2012-08-20 NOTE — Telephone Encounter (Signed)
Patient made aware of recommendation and appt made for next Friday.

## 2012-08-28 ENCOUNTER — Encounter (INDEPENDENT_AMBULATORY_CARE_PROVIDER_SITE_OTHER): Payer: Self-pay | Admitting: Surgery

## 2012-08-28 ENCOUNTER — Ambulatory Visit (INDEPENDENT_AMBULATORY_CARE_PROVIDER_SITE_OTHER): Payer: Federal, State, Local not specified - PPO | Admitting: Surgery

## 2012-08-28 VITALS — BP 134/72 | HR 84 | Temp 97.3°F | Resp 16 | Ht 66.0 in | Wt 191.0 lb

## 2012-08-28 DIAGNOSIS — N6019 Diffuse cystic mastopathy of unspecified breast: Secondary | ICD-10-CM

## 2012-08-28 NOTE — Patient Instructions (Signed)
Continue routine annual mammograms and follow ups with your primary care and GYN. See me again if any changes in your breaast occur

## 2012-08-28 NOTE — Progress Notes (Signed)
ZO:[id number] of bilateral breast pain with history of fibrocystic changes and dominant cysts  History of present illness: I saw this patient several months ago. She had some cysts found on ultrasound and was having bilateral pain primarily in the upper outer quadrants of each breast. She come back for recheck today. Her pain seems to be cyclical about once a month, but she hasn't had a period since her hysterectomy. Her ovaries are still present however. She's had no other changes in her breasts. There is no nipple areolar change nipple discharge or prominent lump that she can find.  Past history, family history are all in the electronic medical record, not redictated here.  Exam: Vital signs:BP 134/72  Pulse 84  Temp 97.3 F (36.3 C) (Temporal)  Resp 16  Ht 5\' 6"  (1.676 m)  Wt 191 lb (86.637 kg)  BMI 30.83 kg/m2 General: The patient alert oriented healthy-appearing Breasts: The breasts are symmetric in size and shape. They're tender throughout but most tender in the upper outer quadrants. This particular diffuse nodularity noted in the upper-outer quadrants whereas the medial half of each breast and softer. There is no dominant mass. The skin and nipple areas look normal.  Lymphatics: No axillary supraclavicular adenopathy is present.  Data reviewed: Mammogram and ultrasound report from August: DIGITAL DIAGNOSTIC BILATERAL MAMMOGRAM WITH CAD AND BILATERAL  BREAST ULTRASOUND:  Comparison: With priors  Findings: There is a dense fibroglandular pattern. Two obscured  masses are seen in the upper outer quadrant of the right breast.  There is no new suspicious mass or malignant-type  microcalcifications in either breast.  Mammographic images were processed with CAD.  On physical exam, I do not palpate a discrete mass in either  breast.  Ultrasound is performed, showing there are two simple cysts in the  right breast at 9 o'clock 5 cm from the nipple measuring 0.8 x 0.7  x 1.0 cm and 1.6  x 0.9 x 1.2 cm. There is no solid mass or  abnormal shadowing.  Sonographic evaluation of the area of of clinical concern in the 12  o'clock region of the left breast is negative.  IMPRESSION:  Right breast cysts. No evidence of malignancy.  RECOMMENDATION:  Screening mammogram in 1 year is recommended.  BI-RADS CATEGORY 2: Benign finding(s).  Original Report Authenticated By: Littie Deeds. Judyann Munson, M.D.  Impression: Fibrocystic changes with mastodynia, no evidence of malignancy Plan: I recommended that she continue having close followup with her primary care and GYN. She should have annual mammograms. She'll come back here if she develops any new symptoms or palpates a mass or has other problems.

## 2012-09-23 ENCOUNTER — Other Ambulatory Visit: Payer: Self-pay | Admitting: Gynecology

## 2013-04-05 ENCOUNTER — Other Ambulatory Visit: Payer: Self-pay | Admitting: Family Medicine

## 2013-04-05 DIAGNOSIS — N63 Unspecified lump in unspecified breast: Secondary | ICD-10-CM

## 2013-04-05 DIAGNOSIS — N644 Mastodynia: Secondary | ICD-10-CM

## 2013-04-16 ENCOUNTER — Other Ambulatory Visit: Payer: Federal, State, Local not specified - PPO

## 2013-04-30 ENCOUNTER — Other Ambulatory Visit: Payer: Federal, State, Local not specified - PPO

## 2013-05-11 ENCOUNTER — Ambulatory Visit
Admission: RE | Admit: 2013-05-11 | Discharge: 2013-05-11 | Disposition: A | Discharge status: Home / Self Care | Payer: Federal, State, Local not specified - PPO | Source: Ambulatory Visit | Attending: Family Medicine | Admitting: Family Medicine

## 2013-05-11 DIAGNOSIS — N63 Unspecified lump in unspecified breast: Secondary | ICD-10-CM

## 2013-05-11 DIAGNOSIS — N644 Mastodynia: Secondary | ICD-10-CM

## 2013-11-15 ENCOUNTER — Encounter: Payer: Self-pay | Admitting: Neurology

## 2013-11-15 ENCOUNTER — Ambulatory Visit (INDEPENDENT_AMBULATORY_CARE_PROVIDER_SITE_OTHER): Payer: Federal, State, Local not specified - PPO | Admitting: Neurology

## 2013-11-15 ENCOUNTER — Telehealth: Payer: Self-pay | Admitting: Neurology

## 2013-11-15 VITALS — BP 130/100 | HR 78 | Temp 98.3°F | Resp 20 | Ht 67.0 in | Wt 203.0 lb

## 2013-11-15 DIAGNOSIS — M531 Cervicobrachial syndrome: Secondary | ICD-10-CM

## 2013-11-15 DIAGNOSIS — IMO0002 Reserved for concepts with insufficient information to code with codable children: Secondary | ICD-10-CM

## 2013-11-15 DIAGNOSIS — G43709 Chronic migraine without aura, not intractable, without status migrainosus: Secondary | ICD-10-CM

## 2013-11-15 DIAGNOSIS — M5481 Occipital neuralgia: Secondary | ICD-10-CM

## 2013-11-15 NOTE — Progress Notes (Signed)
NEUROLOGY CONSULTATION NOTE  Dana Nichols MRN: [id number] DOB: 28-Feb-1961  Referring provider: Dr. Clelia Croft Primary care provider: Dr. Clelia Croft  Reason for consult:  headache  HISTORY OF PRESENT ILLNESS: Dana Nichols is a 53 year old right-handed female with history of fibrocystic breast disease, HSV 2, fibromyalgia, IBS, migraines, hyperlipidemia, and bipolar and borderline personality disorder who presents for migraines.  Records and images were personally reviewed where available.    Onset:  2 years ago Location:  Left suboccipital region, radiates down left side of neck Quality:  stabbing Intensity:  10/10 Aura:  no Associated symptoms:  Nausea, vomiting, photophobia, phonophobia Duration:  Constant but fluctuates in intensity, lasting 45 minutes Frequency:  Constant but fluctuates in intensity, daily Triggers/exacerbating factors:  none Relieving factors:  Applying cold compresses or pressure on the area Activity:  Lays down in dark quiet room  Past abortive therapy:  none Past preventative therapy:  Botox but only to the suboccipital and neck muscles, message therapy  Current abortive therapy:  Aleve (helps.  Takes daily).  Question of taking sumatriptan but not familiar to patient Current preventative therapy:  Topamax (PCP notes list 50mg  TID but our records say 25mg  TID).    Caffeine:  One cola daily Stress/depression:  yes Sleep hygiene:  poor History of migraine:  no Family history of migraine:  father  Current medications include:  Latuda, Cymbalta 30mg , gabapentin 600mg  TID.  She also has history of spasticity and weakness in left arm.  She had shoulder surgery in 1997.  Afterwards, she developed weakness in right arm, with flexion at the elbow.  At the same time, she developed tremor, bilaterally but worse in right arm, as well as involving the head.  She reports that tremor runs in her family.  Around the same time, she developed fainting spells.  She was told  that they were dissociated spells.  They seem to be triggered when experiencing severe pain.  Cardiac workup was normal.  Has been seen by several specialists for this, both cardiology and neurology, including at [facility].  She no longer drives.  She has not worked since 1999.  PAST MEDICAL HISTORY: Past Medical History  Diagnosis Date  . Anemia   . Arthritis   . Fibromyalgia   . Hypertension     PAST SURGICAL HISTORY: Past Surgical History  Procedure Laterality Date  . Shoulder arthroscopy  05/22/95  . Back surgery  12/2009    MEDICATIONS: Current Outpatient Prescriptions on File Prior to Visit  Medication Sig Dispense Refill  . amLODipine (NORVASC) 10 MG tablet Take 10 mg by mouth Daily.      . celecoxib (CELEBREX) 200 MG capsule Take 200 mg by mouth 2 (two) times daily.      Marland Kitchen doxycycline (VIBRAMYCIN) 100 MG capsule       . DULoxetine (CYMBALTA) 30 MG capsule Take 30 mg by mouth at bedtime and may repeat dose one time if needed.      . fluticasone (CUTIVATE) 0.05 % cream Apply 0.05 % topically as needed.      . gabapentin (NEURONTIN) 600 MG tablet Take 600 mg by mouth 3 (three) times daily.      Marland Kitchen topiramate (TOPAMAX) 25 MG tablet Take 25 mg by mouth 3 (three) times daily.      . valACYclovir (VALTREX) 1000 MG tablet Take 1 g by mouth 2 (two) times daily.       No current facility-administered medications on file prior to visit.  ALLERGIES: Allergies  Allergen Reactions  . Codeine     Not even sure - mother told pt she was allergic     FAMILY HISTORY: Family History  Problem Relation Age of Onset  . Diabetes Mother   . Hyperlipidemia Mother   . Hypertension Mother   . Diabetes Sister   . Hyperlipidemia Sister   . Diabetes Brother   . Hyperlipidemia Brother   . Hypertension Brother   . Diabetes Sister     SOCIAL HISTORY: History   Social History  . Marital Status: Married    Spouse Name: N/A    Number of Children: N/A  . Years of Education: N/A    Occupational History  . Not on file.   Social History Main Topics  . Smoking status: Never Smoker   . Smokeless tobacco: Never Used  . Alcohol Use: No  . Drug Use: No  . Sexual Activity: Not on file   Other Topics Concern  . Not on file   Social History Narrative  . No narrative on file    REVIEW OF SYSTEMS: Constitutional: No fevers, chills, or sweats, no generalized fatigue, change in appetite Eyes: No visual changes, double vision, eye pain Ear, nose and throat: No hearing loss, ear pain, nasal congestion, sore throat Cardiovascular: No chest pain, palpitations Respiratory:  No shortness of breath at rest or with exertion, wheezes GastrointestinaI: No nausea, vomiting, diarrhea, abdominal pain, fecal incontinence Genitourinary:  No dysuria, urinary retention or frequency Musculoskeletal:  No neck pain, back pain Integumentary: No rash, pruritus, skin lesions Neurological: as above Psychiatric: No depression, insomnia, anxiety Endocrine: No palpitations, fatigue, diaphoresis, mood swings, change in appetite, change in weight, increased thirst Hematologic/Lymphatic:  No anemia, purpura, petechiae. Allergic/Immunologic: no itchy/runny eyes, nasal congestion, recent allergic reactions, rashes  PHYSICAL EXAM: Filed Vitals:   11/15/13 0950  BP: 130/100  Pulse: 78  Temp: 98.3 F (36.8 C)  Resp: 20   General: No acute distress Head:  Normocephalic/atraumatic Neck: supple, no paraspinal tenderness, full range of motion Back: No paraspinal tenderness Heart: regular rate and rhythm Lungs: Clear to auscultation bilaterally. Vascular: No carotid bruits. Neurological Exam: Mental status: alert and oriented to person, place, and time, speech fluent and not dysarthric, language intact. Cranial nerves: CN I: not tested CN II: pupils equal, round and reactive to light, visual fields intact, fundi unremarkable. CN III, IV, VI:  full range of motion, no nystagmus, no  ptosis CN V: facial sensation intact CN VII: upper and lower face symmetric CN VIII: hearing intact CN IX, X: gag intact, uvula midline CN XI: sternocleidomastoid and trapezius muscles intact CN XII: tongue midline Bulk & Tone: normal, no fasciculations. Motor: 3+ right triceps and grip, otherwise 5/5.  Mild cogwheel rigidity in right wrist.  No bradykinesia.  Mixed tremor in right hand.  Intention tremor in left hand. Sensation: temperature and vibration intact. Deep Tendon Reflexes: 2+ throughout, toes down Finger to nose testing: No dysmetria.  Mixed tremor in right hand, intention tremor in left hand. Heel to shin: no dysmetria Gait: normal stance and stride.  Able to turn, walk on toes, heels and in tandem. Romberg negative.  IMPRESSION: 1.  Chronic migraine without aura, possibly triggered by occipital neuralgia and medication-overuse 2.  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  Tremor   PLAN: 1.  Will try left occipital nerve block. 2.  We will refer for biofeedback to address the daily headaches 3.  Other options to consider would be to increase the  topiramate or start propranolol for the headaches.  There are two separate doses of topamax provided.  Advised to find out how much topiramate she takes and to let us know.  4.  Limit pain relievers and caffeine to no more than 2 days out of the week and only take it when you have a severe headache.  Consider Excedrin too. 5.  FOllow up 4 weeks after injection.  Thank you for allowing me to take part in the care of this patient.  Shon MilletAdam Rockland Kotarski, DO  CC:  Lupita RaiderKimberlee Shaw, MD

## 2013-11-15 NOTE — Telephone Encounter (Signed)
Someone called, pt was on long distance call. She was seen by Dr. Everlena CooperJaffe today. Please call / Sherri

## 2013-11-15 NOTE — Patient Instructions (Signed)
1.  I would like to schedule you in for an occipital nerve block to see if that helps the the headache. 2.  We will refer you for biofeedback to address the daily headaches 3.  Other options to consider would be to increase the topiramate or start propranolol for the headaches.  Find out how much topiramate you take.  Side effects of propranolol (which is a blood pressure medication) include lowering heart rate, dizziness, lightheadedness and sleepiness. 4.  Limit pain relievers and caffeine to no more than 2 days out of the week and only take it when you have a severe headache.  Consider Excedrin too. 5.  FOllow up 4 weeks after injection.

## 2013-11-18 ENCOUNTER — Ambulatory Visit (INDEPENDENT_AMBULATORY_CARE_PROVIDER_SITE_OTHER): Payer: Federal, State, Local not specified - PPO | Admitting: Neurology

## 2013-11-18 DIAGNOSIS — M5481 Occipital neuralgia: Secondary | ICD-10-CM

## 2013-11-18 DIAGNOSIS — M531 Cervicobrachial syndrome: Secondary | ICD-10-CM

## 2013-11-18 MED ORDER — TRIAMCINOLONE ACETONIDE 40 MG/ML IJ SUSP
40.0000 mg | Freq: Once | INTRAMUSCULAR | Status: AC
  Administered 2013-11-18: 40 mg

## 2013-11-18 MED ORDER — BUPIVACAINE HCL 0.25 % IJ SOLN
3.0000 mL | Freq: Once | INTRAMUSCULAR | Status: AC
  Administered 2013-11-18: 3 mL

## 2013-11-18 NOTE — Progress Notes (Signed)
See procedure note.

## 2013-11-18 NOTE — Procedures (Signed)
Procedure: Occipital nerve block  Procedure risks, benefits and alternative treatments explained to patient and they agree to proceed. Written consent was obtained from the patient.  A concentration of 0.25% bupivacaine (3 ml) was mixed with 40 mg of Kenalog (1 ml). A 27 gauge needle was used for injection. The region of the left greater occipital nerve was located by palpation. The area was prepped with alcohol. A total of 2 cc of the above mixture was injected without difficulty. The patient tolerated the procedure well.    Trestan Vahle R. Everlena CooperJaffe, DO

## 2013-12-13 ENCOUNTER — Ambulatory Visit: Payer: Federal, State, Local not specified - PPO | Admitting: Neurology

## 2014-03-10 ENCOUNTER — Encounter: Payer: Self-pay | Admitting: *Deleted

## 2014-04-07 ENCOUNTER — Other Ambulatory Visit (HOSPITAL_COMMUNITY): Payer: Self-pay | Admitting: Family Medicine

## 2014-04-07 DIAGNOSIS — Z1231 Encounter for screening mammogram for malignant neoplasm of breast: Secondary | ICD-10-CM

## 2014-05-12 ENCOUNTER — Ambulatory Visit (HOSPITAL_COMMUNITY)
Admission: RE | Admit: 2014-05-12 | Discharge: 2014-05-12 | Disposition: A | Discharge status: Home / Self Care | Payer: Federal, State, Local not specified - PPO | Source: Ambulatory Visit | Attending: Family Medicine | Admitting: Family Medicine

## 2014-05-12 DIAGNOSIS — Z1231 Encounter for screening mammogram for malignant neoplasm of breast: Secondary | ICD-10-CM | POA: Diagnosis present

## 2015-03-24 ENCOUNTER — Other Ambulatory Visit: Payer: Self-pay

## 2015-03-24 DIAGNOSIS — I83813 Varicose veins of bilateral lower extremities with pain: Secondary | ICD-10-CM

## 2015-04-14 ENCOUNTER — Other Ambulatory Visit (HOSPITAL_COMMUNITY): Payer: Self-pay | Admitting: Family Medicine

## 2015-04-14 DIAGNOSIS — Z1231 Encounter for screening mammogram for malignant neoplasm of breast: Secondary | ICD-10-CM

## 2015-04-28 ENCOUNTER — Encounter (HOSPITAL_COMMUNITY): Payer: Federal, State, Local not specified - PPO

## 2015-04-28 ENCOUNTER — Encounter: Payer: Federal, State, Local not specified - PPO | Admitting: Vascular Surgery

## 2015-05-15 ENCOUNTER — Ambulatory Visit (HOSPITAL_COMMUNITY): Payer: Federal, State, Local not specified - PPO | Attending: Family Medicine

## 2015-07-05 ENCOUNTER — Other Ambulatory Visit: Payer: Self-pay

## 2015-07-05 DIAGNOSIS — Z1231 Encounter for screening mammogram for malignant neoplasm of breast: Secondary | ICD-10-CM

## 2015-07-17 ENCOUNTER — Ambulatory Visit
Admission: RE | Admit: 2015-07-17 | Discharge: 2015-07-17 | Disposition: A | Discharge status: Home / Self Care | Payer: Federal, State, Local not specified - PPO | Source: Ambulatory Visit

## 2015-07-17 DIAGNOSIS — Z1231 Encounter for screening mammogram for malignant neoplasm of breast: Secondary | ICD-10-CM

## 2015-07-19 ENCOUNTER — Ambulatory Visit: Payer: Federal, State, Local not specified - PPO

## 2015-08-02 ENCOUNTER — Ambulatory Visit (INDEPENDENT_AMBULATORY_CARE_PROVIDER_SITE_OTHER): Payer: Federal, State, Local not specified - PPO | Admitting: Diagnostic Neuroimaging

## 2015-08-02 ENCOUNTER — Encounter: Payer: Self-pay | Admitting: *Deleted

## 2015-08-02 VITALS — BP 133/95 | HR 86 | Ht 66.0 in | Wt 191.0 lb

## 2015-08-02 DIAGNOSIS — G43719 Chronic migraine without aura, intractable, without status migrainosus: Secondary | ICD-10-CM

## 2015-08-02 DIAGNOSIS — M5481 Occipital neuralgia: Secondary | ICD-10-CM | POA: Diagnosis not present

## 2015-08-02 NOTE — Patient Instructions (Signed)
Thank you for coming to see [place] at [facility]. I hope we have been able to provide you high quality care today.  You may receive a patient satisfaction survey over the next few weeks. We would appreciate your feedback and comments so that we may continue to improve ourselves and the health of our patients.  - continue current medications - follow up with Dr. Luetta Nutting   ~~~~~~~~~~~~~~~~~~~~~~~~~~~~~~~~~~~~~~~~~~~~~~~~~~~~~~~~~~~~~~~~~  DR. PENUMALLI'S GUIDE TO HAPPY AND HEALTHY LIVING These are some of my general health and wellness recommendations. Some of them may apply to you better than others. Please use common sense as you try these suggestions and feel free to ask me any questions.   ACTIVITY/FITNESS Mental, social, emotional and physical stimulation are very important for brain and body health. Try learning a new activity (arts, music, language, sports, games).  Keep moving your body to the best of your abilities. You can do this at home, inside or outside, the park, community center, gym or anywhere you like. Consider a physical therapist or personal trainer to get started. Consider the app Sworkit. Fitness trackers such as smart-watches, smart-phones or Fitbits can help as well.   NUTRITION Eat more plants: colorful vegetables, nuts, seeds and berries.  Eat less sugar, salt, preservatives and processed foods.  Avoid toxins such as cigarettes and alcohol.  Drink water when you are thirsty. Warm water with a slice of lemon is an excellent morning drink to start the day.  Consider these websites for more information The Nutrition Source (https://www.henry-hernandez.biz/) Precision Nutrition (WindowBlog.ch)   RELAXATION Consider practicing mindfulness meditation or other relaxation techniques such as deep breathing, prayer, yoga, tai chi, massage. See website mindful.org or the apps Headspace or Calm to help get  started.   SLEEP Try to get at least 7-8+ hours sleep per day. Regular exercise and reduced caffeine will help you sleep better. Practice good sleep hygeine techniques. See website sleep.org for more information.   PLANNING Prepare estate planning, living will, healthcare POA documents. Sometimes this is best planned with the help of an attorney. Theconversationproject.org and agingwithdignity.org are excellent resources.
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
## 2016-01-10 ENCOUNTER — Ambulatory Visit
Admission: RE | Admit: 2016-01-10 | Discharge: 2016-01-10 | Disposition: A | Discharge status: Home / Self Care | Payer: Federal, State, Local not specified - PPO | Source: Ambulatory Visit | Attending: Family Medicine | Admitting: Family Medicine

## 2016-01-10 ENCOUNTER — Other Ambulatory Visit: Payer: Self-pay | Admitting: Family Medicine

## 2016-01-10 DIAGNOSIS — N390 Urinary tract infection, site not specified: Secondary | ICD-10-CM | POA: Diagnosis not present

## 2016-01-10 DIAGNOSIS — R0602 Shortness of breath: Secondary | ICD-10-CM | POA: Diagnosis not present

## 2016-01-10 DIAGNOSIS — R042 Hemoptysis: Secondary | ICD-10-CM

## 2016-01-10 DIAGNOSIS — J329 Chronic sinusitis, unspecified: Secondary | ICD-10-CM | POA: Diagnosis not present

## 2016-01-10 DIAGNOSIS — R3911 Hesitancy of micturition: Secondary | ICD-10-CM | POA: Diagnosis not present

## 2016-02-15 DIAGNOSIS — Z86010 Personal history of colonic polyps: Secondary | ICD-10-CM | POA: Diagnosis not present

## 2016-02-15 DIAGNOSIS — K640 First degree hemorrhoids: Secondary | ICD-10-CM | POA: Diagnosis not present

## 2016-02-23 ENCOUNTER — Institutional Professional Consult (permissible substitution): Payer: Federal, State, Local not specified - PPO | Admitting: Diagnostic Neuroimaging

## 2016-03-14 DIAGNOSIS — M25511 Pain in right shoulder: Secondary | ICD-10-CM | POA: Diagnosis not present

## 2016-06-04 DIAGNOSIS — I10 Essential (primary) hypertension: Secondary | ICD-10-CM | POA: Diagnosis not present

## 2016-06-04 DIAGNOSIS — Z23 Encounter for immunization: Secondary | ICD-10-CM | POA: Diagnosis not present

## 2016-06-04 DIAGNOSIS — E782 Mixed hyperlipidemia: Secondary | ICD-10-CM | POA: Diagnosis not present

## 2016-06-12 DIAGNOSIS — R350 Frequency of micturition: Secondary | ICD-10-CM | POA: Diagnosis not present

## 2016-06-18 ENCOUNTER — Other Ambulatory Visit: Payer: Self-pay | Admitting: Family Medicine

## 2016-06-18 DIAGNOSIS — Z1231 Encounter for screening mammogram for malignant neoplasm of breast: Secondary | ICD-10-CM

## 2016-07-17 ENCOUNTER — Ambulatory Visit
Admission: RE | Admit: 2016-07-17 | Discharge: 2016-07-17 | Disposition: A | Discharge status: Home / Self Care | Payer: Federal, State, Local not specified - PPO | Source: Ambulatory Visit | Attending: Family Medicine | Admitting: Family Medicine

## 2016-07-17 DIAGNOSIS — Z1231 Encounter for screening mammogram for malignant neoplasm of breast: Secondary | ICD-10-CM

## 2016-08-22 DIAGNOSIS — M545 Low back pain: Secondary | ICD-10-CM | POA: Diagnosis not present

## 2016-08-29 DIAGNOSIS — M545 Low back pain: Secondary | ICD-10-CM | POA: Diagnosis not present

## 2016-09-05 DIAGNOSIS — M545 Low back pain: Secondary | ICD-10-CM | POA: Diagnosis not present

## 2016-09-05 DIAGNOSIS — M5116 Intervertebral disc disorders with radiculopathy, lumbar region: Secondary | ICD-10-CM | POA: Diagnosis not present

## 2016-09-19 DIAGNOSIS — M5116 Intervertebral disc disorders with radiculopathy, lumbar region: Secondary | ICD-10-CM | POA: Diagnosis not present

## 2016-09-19 DIAGNOSIS — M545 Low back pain: Secondary | ICD-10-CM | POA: Diagnosis not present

## 2016-11-20 DIAGNOSIS — Z5181 Encounter for therapeutic drug level monitoring: Secondary | ICD-10-CM | POA: Diagnosis not present

## 2016-11-20 DIAGNOSIS — F419 Anxiety disorder, unspecified: Secondary | ICD-10-CM | POA: Diagnosis not present

## 2016-11-20 DIAGNOSIS — F29 Unspecified psychosis not due to a substance or known physiological condition: Secondary | ICD-10-CM | POA: Diagnosis not present

## 2016-11-20 DIAGNOSIS — G8929 Other chronic pain: Secondary | ICD-10-CM | POA: Diagnosis not present

## 2016-11-20 DIAGNOSIS — F22 Delusional disorders: Secondary | ICD-10-CM | POA: Diagnosis not present

## 2016-11-20 DIAGNOSIS — R51 Headache: Secondary | ICD-10-CM | POA: Diagnosis not present

## 2016-11-20 DIAGNOSIS — S0181XD Laceration without foreign body of other part of head, subsequent encounter: Secondary | ICD-10-CM | POA: Diagnosis not present

## 2016-11-20 DIAGNOSIS — E669 Obesity, unspecified: Secondary | ICD-10-CM | POA: Diagnosis not present

## 2016-11-20 DIAGNOSIS — R45851 Suicidal ideations: Secondary | ICD-10-CM | POA: Diagnosis not present

## 2016-11-21 DIAGNOSIS — E669 Obesity, unspecified: Secondary | ICD-10-CM | POA: Diagnosis not present

## 2016-11-21 DIAGNOSIS — F319 Bipolar disorder, unspecified: Secondary | ICD-10-CM | POA: Diagnosis not present

## 2016-11-21 DIAGNOSIS — F419 Anxiety disorder, unspecified: Secondary | ICD-10-CM | POA: Diagnosis not present

## 2016-11-21 DIAGNOSIS — G8929 Other chronic pain: Secondary | ICD-10-CM | POA: Diagnosis not present

## 2016-11-21 DIAGNOSIS — I10 Essential (primary) hypertension: Secondary | ICD-10-CM | POA: Diagnosis not present

## 2016-11-21 DIAGNOSIS — F603 Borderline personality disorder: Secondary | ICD-10-CM | POA: Diagnosis not present

## 2016-11-21 DIAGNOSIS — G9050 Complex regional pain syndrome I, unspecified: Secondary | ICD-10-CM | POA: Diagnosis not present

## 2016-11-21 DIAGNOSIS — Z915 Personal history of self-harm: Secondary | ICD-10-CM | POA: Diagnosis not present

## 2016-11-21 DIAGNOSIS — M797 Fibromyalgia: Secondary | ICD-10-CM | POA: Diagnosis not present

## 2016-11-21 DIAGNOSIS — G90511 Complex regional pain syndrome I of right upper limb: Secondary | ICD-10-CM | POA: Diagnosis not present

## 2016-11-21 DIAGNOSIS — F22 Delusional disorders: Secondary | ICD-10-CM | POA: Diagnosis not present

## 2016-11-21 DIAGNOSIS — Z720 Tobacco use: Secondary | ICD-10-CM | POA: Diagnosis not present

## 2016-11-21 DIAGNOSIS — F4310 Post-traumatic stress disorder, unspecified: Secondary | ICD-10-CM | POA: Diagnosis not present

## 2016-11-21 DIAGNOSIS — K589 Irritable bowel syndrome without diarrhea: Secondary | ICD-10-CM | POA: Diagnosis not present

## 2016-11-21 DIAGNOSIS — F29 Unspecified psychosis not due to a substance or known physiological condition: Secondary | ICD-10-CM | POA: Diagnosis not present

## 2016-11-21 DIAGNOSIS — Z79899 Other long term (current) drug therapy: Secondary | ICD-10-CM | POA: Diagnosis not present

## 2016-11-21 DIAGNOSIS — R51 Headache: Secondary | ICD-10-CM | POA: Diagnosis not present

## 2016-11-21 DIAGNOSIS — R45851 Suicidal ideations: Secondary | ICD-10-CM | POA: Diagnosis not present

## 2016-11-21 DIAGNOSIS — J302 Other seasonal allergic rhinitis: Secondary | ICD-10-CM | POA: Diagnosis not present

## 2016-11-21 DIAGNOSIS — F209 Schizophrenia, unspecified: Secondary | ICD-10-CM | POA: Diagnosis not present

## 2016-11-21 DIAGNOSIS — B009 Herpesviral infection, unspecified: Secondary | ICD-10-CM | POA: Diagnosis not present

## 2016-11-21 DIAGNOSIS — S0181XD Laceration without foreign body of other part of head, subsequent encounter: Secondary | ICD-10-CM | POA: Diagnosis not present

## 2016-11-22 DIAGNOSIS — M797 Fibromyalgia: Secondary | ICD-10-CM | POA: Diagnosis not present

## 2016-11-22 DIAGNOSIS — G90511 Complex regional pain syndrome I of right upper limb: Secondary | ICD-10-CM | POA: Diagnosis not present

## 2016-11-22 DIAGNOSIS — F603 Borderline personality disorder: Secondary | ICD-10-CM | POA: Diagnosis not present

## 2016-11-23 DIAGNOSIS — F29 Unspecified psychosis not due to a substance or known physiological condition: Secondary | ICD-10-CM | POA: Diagnosis not present

## 2016-11-23 DIAGNOSIS — M797 Fibromyalgia: Secondary | ICD-10-CM | POA: Diagnosis not present

## 2016-11-23 DIAGNOSIS — F603 Borderline personality disorder: Secondary | ICD-10-CM | POA: Diagnosis not present

## 2016-11-23 DIAGNOSIS — G90511 Complex regional pain syndrome I of right upper limb: Secondary | ICD-10-CM | POA: Diagnosis not present

## 2016-11-24 DIAGNOSIS — G90511 Complex regional pain syndrome I of right upper limb: Secondary | ICD-10-CM | POA: Diagnosis not present

## 2016-11-24 DIAGNOSIS — F29 Unspecified psychosis not due to a substance or known physiological condition: Secondary | ICD-10-CM | POA: Diagnosis not present

## 2016-11-24 DIAGNOSIS — M797 Fibromyalgia: Secondary | ICD-10-CM | POA: Diagnosis not present

## 2016-11-24 DIAGNOSIS — F603 Borderline personality disorder: Secondary | ICD-10-CM | POA: Diagnosis not present

## 2016-11-25 DIAGNOSIS — F603 Borderline personality disorder: Secondary | ICD-10-CM | POA: Diagnosis not present

## 2016-11-25 DIAGNOSIS — G90511 Complex regional pain syndrome I of right upper limb: Secondary | ICD-10-CM | POA: Diagnosis not present

## 2016-11-25 DIAGNOSIS — F29 Unspecified psychosis not due to a substance or known physiological condition: Secondary | ICD-10-CM | POA: Diagnosis not present

## 2016-11-25 DIAGNOSIS — M797 Fibromyalgia: Secondary | ICD-10-CM | POA: Diagnosis not present

## 2016-11-27 DIAGNOSIS — R233 Spontaneous ecchymoses: Secondary | ICD-10-CM | POA: Diagnosis not present

## 2016-11-27 DIAGNOSIS — L309 Dermatitis, unspecified: Secondary | ICD-10-CM | POA: Diagnosis not present

## 2017-01-07 DIAGNOSIS — K589 Irritable bowel syndrome without diarrhea: Secondary | ICD-10-CM | POA: Diagnosis not present

## 2017-01-07 DIAGNOSIS — Z0000 Encounter for general adult medical examination without abnormal findings: Secondary | ICD-10-CM | POA: Diagnosis not present

## 2017-01-07 DIAGNOSIS — I10 Essential (primary) hypertension: Secondary | ICD-10-CM | POA: Diagnosis not present

## 2017-01-07 DIAGNOSIS — D649 Anemia, unspecified: Secondary | ICD-10-CM | POA: Diagnosis not present

## 2017-01-07 DIAGNOSIS — N760 Acute vaginitis: Secondary | ICD-10-CM | POA: Diagnosis not present

## 2017-02-12 ENCOUNTER — Other Ambulatory Visit: Payer: Self-pay | Admitting: Family Medicine

## 2017-02-12 ENCOUNTER — Ambulatory Visit
Admission: RE | Admit: 2017-02-12 | Discharge: 2017-02-12 | Disposition: A | Discharge status: Home / Self Care | Payer: Federal, State, Local not specified - PPO | Source: Ambulatory Visit | Attending: Family Medicine | Admitting: Family Medicine

## 2017-02-12 DIAGNOSIS — M533 Sacrococcygeal disorders, not elsewhere classified: Secondary | ICD-10-CM | POA: Diagnosis not present

## 2017-02-24 DIAGNOSIS — J069 Acute upper respiratory infection, unspecified: Secondary | ICD-10-CM | POA: Diagnosis not present

## 2017-02-28 DIAGNOSIS — L659 Nonscarring hair loss, unspecified: Secondary | ICD-10-CM | POA: Diagnosis not present

## 2017-02-28 DIAGNOSIS — G43909 Migraine, unspecified, not intractable, without status migrainosus: Secondary | ICD-10-CM | POA: Diagnosis not present

## 2017-06-05 ENCOUNTER — Other Ambulatory Visit: Payer: Self-pay | Admitting: Family Medicine

## 2017-06-05 DIAGNOSIS — Z1231 Encounter for screening mammogram for malignant neoplasm of breast: Secondary | ICD-10-CM

## 2017-07-21 ENCOUNTER — Ambulatory Visit
Admission: RE | Admit: 2017-07-21 | Discharge: 2017-07-21 | Disposition: A | Discharge status: Home / Self Care | Payer: Federal, State, Local not specified - PPO | Source: Ambulatory Visit | Attending: Family Medicine | Admitting: Family Medicine

## 2017-07-21 DIAGNOSIS — Z1231 Encounter for screening mammogram for malignant neoplasm of breast: Secondary | ICD-10-CM | POA: Diagnosis not present

## 2017-11-11 DIAGNOSIS — L720 Epidermal cyst: Secondary | ICD-10-CM | POA: Diagnosis not present

## 2017-11-11 DIAGNOSIS — R300 Dysuria: Secondary | ICD-10-CM | POA: Diagnosis not present

## 2018-01-23 ENCOUNTER — Inpatient Hospital Stay: Payer: Federal, State, Local not specified - PPO | Attending: Genetic Counselor | Admitting: Genetic Counselor

## 2018-01-23 ENCOUNTER — Encounter: Payer: Self-pay | Admitting: Genetic Counselor

## 2018-01-23 ENCOUNTER — Inpatient Hospital Stay: Payer: Federal, State, Local not specified - PPO

## 2018-01-23 DIAGNOSIS — Z803 Family history of malignant neoplasm of breast: Secondary | ICD-10-CM | POA: Diagnosis not present

## 2018-01-23 DIAGNOSIS — Z1379 Encounter for other screening for genetic and chromosomal anomalies: Secondary | ICD-10-CM
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
## 2018-01-26 DIAGNOSIS — Z0000 Encounter for general adult medical examination without abnormal findings: Secondary | ICD-10-CM | POA: Diagnosis not present

## 2018-01-26 DIAGNOSIS — I10 Essential (primary) hypertension: Secondary | ICD-10-CM | POA: Diagnosis not present

## 2018-01-26 DIAGNOSIS — E782 Mixed hyperlipidemia: Secondary | ICD-10-CM | POA: Diagnosis not present

## 2018-01-26 DIAGNOSIS — D649 Anemia, unspecified: Secondary | ICD-10-CM | POA: Diagnosis not present

## 2018-01-29 ENCOUNTER — Encounter: Payer: Self-pay | Admitting: Genetic Counselor

## 2018-01-29 DIAGNOSIS — Z1379 Encounter for other screening for genetic and chromosomal anomalies: Secondary | ICD-10-CM | POA: Insufficient documentation

## 2018-01-29 NOTE — Progress Notes (Signed)
[facility]       Genetic Test Results    Patient Name: Dana Nichols Patient DOB: [birth year] Patient Age: 57 y.o. Encounter Date: 01/30/2018  Referring Provider: Self referred  Primary Care Provider: Mayra Neer, MD   Ms. Roa was called today to discuss genetic test results. Please see the Genetics note from her visit on 01/23/2018 for a detailed discussion of her personal and family history.  Genetic Testing: At the time of Ms. Anna's visit, she decided to pursue genetic testing of multiple genes associated with hereditary susceptibility to cancer. Testing included sequencing and deletion/duplication analysis. Testing did not reveal a pathogenic mutation in any of the genes analyzed.  A copy of the genetic test report will be scanned into Epic under the Media tab.  The genes analyzed were the 83 genes on [facility]'s Multi-Cancer panel (ALK, APC, ATM, AXIN2, BAP1, BARD1, BLM, BMPR1A, BRCA1, BRCA2, BRIP1, CASR, CDC73, CDH1, CDK4, CDKN1B, CDKN1C, CDKN2A, CEBPA, CHEK2, CTNNA1, DICER1, DIS3L2, EGFR, EPCAM, FH, FLCN, GATA2, GPC3, GREM1, HOXB13, HRAS, KIT, MAX, MEN1, MET, MITF, MLH1, MSH2, MSH3, MSH6, MUTYH, NBN, NF1, NF2, NTHL1, PALB2, PDGFRA, PHOX2B, PMS2, POLD1, POLE, POT1, PRKAR1A, PTCH1, PTEN, RAD50, RAD51C, RAD51D, RB1, RECQL4, RET, RUNX1, SDHA, SDHAF2, SDHB, SDHC, SDHD, SMAD4, SMARCA4, SMARCB1, SMARCE1, STK11, SUFU, TERC, TERT, TMEM127, TP53, TSC1, TSC2, VHL, WRN, WT1).  Since the current test is not perfect, it is possible that there may be a gene mutation that current testing cannot detect, but that chance is small. It is possible that a different genetic factor, which has not yet been discovered or is not on this panel, is responsible for the cancer diagnoses in the family. Again, the likelihood of this is low. No additional testing is recommended at this time for Ms. Edmiston.  A Variant of Uncertain Significance was detected: CASR c.[id number]  (p.[id number]). This is still considered a normal result. While at this time, it is unknown if this finding is associated with increased cancer risk, the majority of these variants get reclassified to be inconsequential. Medical management should not be based on this finding. With time, we suspect the lab will determine the significance, if any. If we do learn more about it, we will try to contact Ms. Linden to discuss it further. It is important to stay in touch with [place] periodically and keep the address and phone number up to date.  Cancer Screening: These results are reassuring and indicate that Ms. Regnier does not likely have an increased risk of cancer due to a mutation in one of these genes. She is recommended to undergo cancer screenings for individuals in the general population. The [employer]  recommends that women follow the breast screening recommendations below, but that these may need to be modified based on other risk factors such as dense breasts, biopsy history or family history.  Breast awareness - Women should be familiar with their breasts and promptly report changes to their healthcare provider.   Starting at age 12: Breast exam, risk assessment, and risk reduction counseling by the provider and mammogram every year. The provider may discuss screening with tomosynthesis.  Ms. Nault is also recommended to undergo a yearly gynecologic exam and speak with her GI doctor as to when to have her next colonoscopy.  Family Members: Family members are at some increased risk of developing cancer, over the general population risk, simply due to the family history.  Women are recommended to have a yearly mammogram beginning at age 10, a yearly clinical breast exam, a yearly gynecologic exam and perform monthly breast self-exams. Colon cancer screening is recommended to begin by age 37 in both men and women, unless there is a family history of colon cancer or colon polyps  or an individual has a personal history to warrant initiating screening at a younger age.  Any relative who had cancer at a young age or had a particularly rare cancer may also wish to pursue genetic testing. Genetic counselors can be located in other cities, by visiting the website of the [employer] (ArtistMovie.se) and Field seismologist for a Dietitian by zip code.   Family members are not recommended to get tested for the above VUS outside of a research protocol as this finding has no implications for their medical management.  Lastly, cancer genetics is a rapidly advancing field and it is possible that new genetic tests will be appropriate for Ms. Thies in the future. Ms. Schweickert is encouraged to remain in contact with Genetics on an annual basis so we can update her personal and family histories, and let her know of advances in cancer genetics that may benefit the family. Ms. Twining questions were answered to her satisfaction today, and she knows she is welcome to call anytime with additional questions.     Steele Berg, MS, [place] Certified Genetic Counselor phone: [phone]

## 2018-01-30 ENCOUNTER — Ambulatory Visit: Payer: Self-pay | Admitting: Genetic Counselor

## 2018-01-30 DIAGNOSIS — Z1379 Encounter for other screening for genetic and chromosomal anomalies: Secondary | ICD-10-CM

## 2018-06-18 ENCOUNTER — Other Ambulatory Visit: Payer: Self-pay | Admitting: Family Medicine

## 2018-06-18 DIAGNOSIS — Z1231 Encounter for screening mammogram for malignant neoplasm of breast: Secondary | ICD-10-CM

## 2018-07-22 ENCOUNTER — Ambulatory Visit
Admission: RE | Admit: 2018-07-22 | Discharge: 2018-07-22 | Disposition: A | Discharge status: Home / Self Care | Payer: Federal, State, Local not specified - PPO | Source: Ambulatory Visit | Attending: Family Medicine | Admitting: Family Medicine

## 2018-07-22 DIAGNOSIS — Z1231 Encounter for screening mammogram for malignant neoplasm of breast: Secondary | ICD-10-CM

## 2019-02-17 DIAGNOSIS — Z0000 Encounter for general adult medical examination without abnormal findings: Secondary | ICD-10-CM | POA: Diagnosis not present

## 2019-02-25 DIAGNOSIS — Z780 Asymptomatic menopausal state: Secondary | ICD-10-CM | POA: Diagnosis not present

## 2019-02-25 DIAGNOSIS — D649 Anemia, unspecified: Secondary | ICD-10-CM | POA: Diagnosis not present

## 2019-02-25 DIAGNOSIS — Z124 Encounter for screening for malignant neoplasm of cervix: Secondary | ICD-10-CM | POA: Diagnosis not present

## 2019-02-25 DIAGNOSIS — Z6831 Body mass index (BMI) 31.0-31.9, adult: Secondary | ICD-10-CM | POA: Diagnosis not present

## 2019-02-25 DIAGNOSIS — Z130 Encounter for screening for diseases of the blood and blood-forming organs and certain disorders involving the immune mechanism: Secondary | ICD-10-CM | POA: Diagnosis not present

## 2019-02-25 DIAGNOSIS — Z1151 Encounter for screening for human papillomavirus (HPV): Secondary | ICD-10-CM | POA: Diagnosis not present

## 2019-02-25 DIAGNOSIS — E782 Mixed hyperlipidemia: Secondary | ICD-10-CM | POA: Diagnosis not present

## 2019-02-25 DIAGNOSIS — Z1389 Encounter for screening for other disorder: Secondary | ICD-10-CM | POA: Diagnosis not present

## 2019-02-25 DIAGNOSIS — I10 Essential (primary) hypertension: Secondary | ICD-10-CM | POA: Diagnosis not present

## 2019-02-25 DIAGNOSIS — Z01419 Encounter for gynecological examination (general) (routine) without abnormal findings: Secondary | ICD-10-CM | POA: Diagnosis not present

## 2019-04-21 ENCOUNTER — Other Ambulatory Visit: Payer: Self-pay

## 2019-04-21 DIAGNOSIS — Z20822 Contact with and (suspected) exposure to covid-19: Secondary | ICD-10-CM

## 2019-04-22 LAB — NOVEL CORONAVIRUS, NAA: SARS-CoV-2, NAA: NOT DETECTED

## 2019-05-27 DIAGNOSIS — M25512 Pain in left shoulder: Secondary | ICD-10-CM | POA: Diagnosis not present

## 2019-06-08 DIAGNOSIS — M25512 Pain in left shoulder: Secondary | ICD-10-CM | POA: Diagnosis not present

## 2019-06-18 ENCOUNTER — Other Ambulatory Visit: Payer: Self-pay | Admitting: Family Medicine

## 2019-06-18 DIAGNOSIS — Z1231 Encounter for screening mammogram for malignant neoplasm of breast: Secondary | ICD-10-CM

## 2019-07-26 ENCOUNTER — Ambulatory Visit
Admission: RE | Admit: 2019-07-26 | Discharge: 2019-07-26 | Disposition: A | Discharge status: Home / Self Care | Payer: Federal, State, Local not specified - PPO | Source: Ambulatory Visit | Attending: Family Medicine | Admitting: Family Medicine

## 2019-07-26 ENCOUNTER — Other Ambulatory Visit: Payer: Self-pay

## 2019-07-26 DIAGNOSIS — Z1231 Encounter for screening mammogram for malignant neoplasm of breast: Secondary | ICD-10-CM

## 2019-09-04 DIAGNOSIS — Z20828 Contact with and (suspected) exposure to other viral communicable diseases: Secondary | ICD-10-CM | POA: Diagnosis not present

## 2019-09-14 DIAGNOSIS — Z20828 Contact with and (suspected) exposure to other viral communicable diseases: Secondary | ICD-10-CM | POA: Diagnosis not present

## 2020-02-23 DIAGNOSIS — E782 Mixed hyperlipidemia: Secondary | ICD-10-CM | POA: Diagnosis not present

## 2020-02-23 DIAGNOSIS — Z0000 Encounter for general adult medical examination without abnormal findings: Secondary | ICD-10-CM | POA: Diagnosis not present

## 2020-02-23 DIAGNOSIS — I10 Essential (primary) hypertension: Secondary | ICD-10-CM | POA: Diagnosis not present

## 2020-02-29 DIAGNOSIS — Z130 Encounter for screening for diseases of the blood and blood-forming organs and certain disorders involving the immune mechanism: Secondary | ICD-10-CM | POA: Diagnosis not present

## 2020-02-29 DIAGNOSIS — Z780 Asymptomatic menopausal state: Secondary | ICD-10-CM | POA: Diagnosis not present

## 2020-02-29 DIAGNOSIS — Z01419 Encounter for gynecological examination (general) (routine) without abnormal findings: Secondary | ICD-10-CM | POA: Diagnosis not present

## 2020-02-29 DIAGNOSIS — Z6832 Body mass index (BMI) 32.0-32.9, adult: Secondary | ICD-10-CM | POA: Diagnosis not present

## 2020-03-10 ENCOUNTER — Emergency Department (HOSPITAL_COMMUNITY)
Admission: EM | Admit: 2020-03-10 | Discharge: 2020-03-10 | Disposition: A | Discharge status: Home / Self Care | Payer: Federal, State, Local not specified - PPO | Attending: Emergency Medicine | Admitting: Emergency Medicine

## 2020-03-10 ENCOUNTER — Encounter (HOSPITAL_COMMUNITY): Payer: Self-pay | Admitting: Emergency Medicine

## 2020-03-10 ENCOUNTER — Emergency Department (HOSPITAL_COMMUNITY): Payer: Federal, State, Local not specified - PPO

## 2020-03-10 DIAGNOSIS — Z91040 Latex allergy status: Secondary | ICD-10-CM | POA: Insufficient documentation

## 2020-03-10 DIAGNOSIS — I10 Essential (primary) hypertension: Secondary | ICD-10-CM | POA: Insufficient documentation

## 2020-03-10 DIAGNOSIS — R0789 Other chest pain: Secondary | ICD-10-CM | POA: Diagnosis not present

## 2020-03-10 DIAGNOSIS — R079 Chest pain, unspecified: Secondary | ICD-10-CM | POA: Diagnosis not present

## 2020-03-10 DIAGNOSIS — R0602 Shortness of breath: Secondary | ICD-10-CM | POA: Diagnosis not present

## 2020-03-10 DIAGNOSIS — Z79899 Other long term (current) drug therapy: Secondary | ICD-10-CM | POA: Diagnosis not present

## 2020-03-10 LAB — BASIC METABOLIC PANEL
Anion gap: 9 (ref 5–15)
BUN: 11 mg/dL (ref 6–20)
CO2: 22 mmol/L (ref 22–32)
Calcium: 9.4 mg/dL (ref 8.9–10.3)
Chloride: 109 mmol/L (ref 98–111)
Creatinine, Ser: 0.95 mg/dL (ref 0.44–1.00)
GFR calc Af Amer: >60 mL/min (ref >60)
GFR calc non Af Amer: >60 mL/min (ref >60)
Glucose, Bld: 86 mg/dL (ref 70–99)
Potassium: 4 mmol/L (ref 3.5–5.1)
Sodium: 140 mmol/L (ref 135–145)

## 2020-03-10 LAB — TROPONIN I (HIGH SENSITIVITY)
Troponin I (High Sensitivity): 3 ng/L (ref <18)
Troponin I (High Sensitivity): 4 ng/L (ref <18)

## 2020-03-10 LAB — CBC
HCT: 42 % (ref 36.0–46.0)
Hemoglobin: 12.7 g/dL (ref 12.0–15.0)
MCH: 27.2 pg (ref 26.0–34.0)
MCHC: 30.2 g/dL (ref 30.0–36.0)
MCV: 89.9 fL (ref 80.0–100.0)
Platelets: 249 10*3/uL (ref 150–400)
RBC: 4.67 MIL/uL (ref 3.87–5.11)
RDW: 12.9 % (ref 11.5–15.5)
WBC: 4.5 10*3/uL (ref 4.0–10.5)
nRBC: 0 % (ref 0.0–0.2)

## 2020-03-10 LAB — I-STAT BETA HCG BLOOD, ED (MC, WL, AP ONLY): I-stat hCG, quantitative: <5 m[IU]/mL (ref <5)

## 2020-03-10 MED ORDER — SODIUM CHLORIDE 0.9% FLUSH: 3.0000 mL | Freq: Once | INTRAVENOUS | Status: DC

## 2020-03-10 NOTE — ED Notes (Signed)
Patient verbalizes understanding of discharge instructions. Opportunity for questioning and answers were provided. Pt discharged from ED. 
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
## 2020-03-10 NOTE — ED Triage Notes (Signed)
Pt reports central to L chest pain with R arm pain that began last Thursday after she got her 2nd shingles vaccine and started rosuvastatin. States she stopped statin 4 days after pain began after talking with her doctor. Reports some lightheadedness at times with hx of the same due a disorder that she has. Pt a/ox4, resp e/u.

## 2020-03-10 NOTE — Discharge Instructions (Addendum)
Please read the attachment on nonspecific chest pain.  I would like for you to follow-up with your primary care provider regarding today's encounter.  You may require medication adjustment given your new onset symptoms of chest discomfort.  Your work-up and physical exam today is entirely reassuring.  I recommend trying naproxen or Tylenol and see if that helps with your symptoms of discomfort.  You may also try antacids such as Tums or Maalox.    Please return to the ED or seek immediate medical attention should you experience any new or worsening symptoms.

## 2020-03-16 DIAGNOSIS — 419620001 Death: Secondary | SNOMED CT | POA: Diagnosis not present

## 2020-03-16 DEATH — deceased

## 2020-04-06 ENCOUNTER — Ambulatory Visit: Payer: Federal, State, Local not specified - PPO | Admitting: Podiatry

## 2020-05-29 DIAGNOSIS — E7800 Pure hypercholesterolemia, unspecified: Secondary | ICD-10-CM | POA: Diagnosis not present

## 2020-06-14 ENCOUNTER — Other Ambulatory Visit: Payer: Self-pay | Admitting: Family Medicine

## 2020-06-14 DIAGNOSIS — Z0000 Encounter for general adult medical examination without abnormal findings: Secondary | ICD-10-CM

## 2020-07-21 DIAGNOSIS — R55 Syncope and collapse: Secondary | ICD-10-CM | POA: Diagnosis not present

## 2020-07-21 DIAGNOSIS — G43909 Migraine, unspecified, not intractable, without status migrainosus: Secondary | ICD-10-CM | POA: Diagnosis not present

## 2020-07-21 DIAGNOSIS — I10 Essential (primary) hypertension: Secondary | ICD-10-CM | POA: Diagnosis not present

## 2020-07-26 ENCOUNTER — Other Ambulatory Visit: Payer: Self-pay

## 2020-07-26 ENCOUNTER — Ambulatory Visit
Admission: RE | Admit: 2020-07-26 | Discharge: 2020-07-26 | Disposition: A | Discharge status: Home / Self Care | Payer: Federal, State, Local not specified - PPO | Source: Ambulatory Visit | Attending: Family Medicine | Admitting: Family Medicine

## 2020-07-26 DIAGNOSIS — Z0000 Encounter for general adult medical examination without abnormal findings: Secondary | ICD-10-CM

## 2020-07-26 DIAGNOSIS — Z1231 Encounter for screening mammogram for malignant neoplasm of breast: Secondary | ICD-10-CM | POA: Diagnosis not present

## 2020-11-24 DIAGNOSIS — G43711 Chronic migraine without aura, intractable, with status migrainosus: Secondary | ICD-10-CM | POA: Diagnosis not present

## 2020-11-24 DIAGNOSIS — F444 Conversion disorder with motor symptom or deficit: Secondary | ICD-10-CM | POA: Diagnosis not present

## 2020-11-24 DIAGNOSIS — R251 Tremor, unspecified: Secondary | ICD-10-CM | POA: Diagnosis not present

## 2020-11-24 DIAGNOSIS — R55 Syncope and collapse: Secondary | ICD-10-CM | POA: Diagnosis not present

## 2021-03-01 DIAGNOSIS — Z6834 Body mass index (BMI) 34.0-34.9, adult: Secondary | ICD-10-CM | POA: Diagnosis not present

## 2021-03-01 DIAGNOSIS — Z01419 Encounter for gynecological examination (general) (routine) without abnormal findings: Secondary | ICD-10-CM | POA: Diagnosis not present

## 2021-03-01 DIAGNOSIS — Z130 Encounter for screening for diseases of the blood and blood-forming organs and certain disorders involving the immune mechanism: Secondary | ICD-10-CM | POA: Diagnosis not present

## 2021-03-01 DIAGNOSIS — A6004 Herpesviral vulvovaginitis: Secondary | ICD-10-CM | POA: Diagnosis not present

## 2021-03-01 DIAGNOSIS — Z780 Asymptomatic menopausal state: Secondary | ICD-10-CM | POA: Diagnosis not present

## 2021-03-06 DIAGNOSIS — E782 Mixed hyperlipidemia: Secondary | ICD-10-CM | POA: Diagnosis not present

## 2021-03-06 DIAGNOSIS — Z0000 Encounter for general adult medical examination without abnormal findings: Secondary | ICD-10-CM | POA: Diagnosis not present

## 2021-03-06 DIAGNOSIS — I10 Essential (primary) hypertension: Secondary | ICD-10-CM | POA: Diagnosis not present

## 2021-03-06 DIAGNOSIS — Z713 Dietary counseling and surveillance: Secondary | ICD-10-CM | POA: Diagnosis not present

## 2021-03-06 DIAGNOSIS — E669 Obesity, unspecified: Secondary | ICD-10-CM | POA: Diagnosis not present

## 2021-06-18 ENCOUNTER — Other Ambulatory Visit: Payer: Self-pay | Admitting: Family Medicine

## 2021-06-18 DIAGNOSIS — Z1231 Encounter for screening mammogram for malignant neoplasm of breast: Secondary | ICD-10-CM

## 2021-06-22 DIAGNOSIS — Z86010 Personal history of colonic polyps: Secondary | ICD-10-CM | POA: Diagnosis not present

## 2021-06-22 DIAGNOSIS — K648 Other hemorrhoids: Secondary | ICD-10-CM | POA: Diagnosis not present

## 2021-06-22 DIAGNOSIS — K5730 Diverticulosis of large intestine without perforation or abscess without bleeding: Secondary | ICD-10-CM | POA: Diagnosis not present

## 2021-07-10 DIAGNOSIS — F449 Dissociative and conversion disorder, unspecified: Secondary | ICD-10-CM | POA: Diagnosis not present

## 2021-07-10 DIAGNOSIS — E782 Mixed hyperlipidemia: Secondary | ICD-10-CM | POA: Diagnosis not present

## 2021-07-10 DIAGNOSIS — E669 Obesity, unspecified: Secondary | ICD-10-CM | POA: Diagnosis not present

## 2021-07-30 ENCOUNTER — Other Ambulatory Visit: Payer: Self-pay

## 2021-07-30 ENCOUNTER — Ambulatory Visit
Admission: RE | Admit: 2021-07-30 | Discharge: 2021-07-30 | Disposition: A | Discharge status: Home / Self Care | Payer: Federal, State, Local not specified - PPO | Source: Ambulatory Visit | Attending: Family Medicine | Admitting: Family Medicine

## 2021-07-30 DIAGNOSIS — Z1231 Encounter for screening mammogram for malignant neoplasm of breast: Secondary | ICD-10-CM | POA: Diagnosis not present

## 2021-09-19 DIAGNOSIS — I10 Essential (primary) hypertension: Secondary | ICD-10-CM | POA: Diagnosis not present

## 2021-11-15 DIAGNOSIS — M7542 Impingement syndrome of left shoulder: Secondary | ICD-10-CM | POA: Diagnosis not present

## 2021-12-11 DIAGNOSIS — R635 Abnormal weight gain: Secondary | ICD-10-CM | POA: Diagnosis not present

## 2021-12-11 DIAGNOSIS — R61 Generalized hyperhidrosis: Secondary | ICD-10-CM | POA: Diagnosis not present

## 2021-12-13 DIAGNOSIS — I10 Essential (primary) hypertension: Secondary | ICD-10-CM | POA: Diagnosis not present

## 2021-12-13 DIAGNOSIS — R232 Flushing: Secondary | ICD-10-CM | POA: Diagnosis not present

## 2022-02-05 DIAGNOSIS — L309 Dermatitis, unspecified: Secondary | ICD-10-CM | POA: Diagnosis not present

## 2022-02-05 DIAGNOSIS — I10 Essential (primary) hypertension: Secondary | ICD-10-CM | POA: Diagnosis not present

## 2022-03-12 DIAGNOSIS — R002 Palpitations: Secondary | ICD-10-CM | POA: Diagnosis not present

## 2022-03-12 DIAGNOSIS — I472 Ventricular tachycardia, unspecified: Secondary | ICD-10-CM | POA: Diagnosis not present

## 2022-03-12 DIAGNOSIS — I471 Supraventricular tachycardia: Secondary | ICD-10-CM | POA: Diagnosis not present

## 2022-03-12 DIAGNOSIS — I10 Essential (primary) hypertension: Secondary | ICD-10-CM | POA: Diagnosis not present

## 2022-03-12 DIAGNOSIS — Z0000 Encounter for general adult medical examination without abnormal findings: Secondary | ICD-10-CM | POA: Diagnosis not present

## 2022-03-12 DIAGNOSIS — E7800 Pure hypercholesterolemia, unspecified: Secondary | ICD-10-CM | POA: Diagnosis not present

## 2022-03-12 DIAGNOSIS — R7303 Prediabetes: Secondary | ICD-10-CM | POA: Diagnosis not present

## 2022-03-12 DIAGNOSIS — R079 Chest pain, unspecified: Secondary | ICD-10-CM | POA: Diagnosis not present

## 2022-03-12 HISTORY — PX: OTHER SURGICAL HISTORY: SHX169

## 2022-03-18 ENCOUNTER — Ambulatory Visit (INDEPENDENT_AMBULATORY_CARE_PROVIDER_SITE_OTHER): Payer: Federal, State, Local not specified - PPO | Admitting: Cardiology

## 2022-03-18 ENCOUNTER — Encounter: Payer: Self-pay | Admitting: Cardiology

## 2022-03-18 VITALS — BP 130/74 | HR 92 | Ht 65.5 in | Wt 227.2 lb

## 2022-03-18 DIAGNOSIS — Z833 Family history of diabetes mellitus: Secondary | ICD-10-CM | POA: Diagnosis not present

## 2022-03-18 DIAGNOSIS — I10 Essential (primary) hypertension: Secondary | ICD-10-CM | POA: Diagnosis not present

## 2022-03-18 DIAGNOSIS — E8881 Metabolic syndrome: Secondary | ICD-10-CM | POA: Diagnosis not present

## 2022-03-18 DIAGNOSIS — R002 Palpitations: Secondary | ICD-10-CM
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
## 2022-03-18 NOTE — Patient Instructions (Signed)
Medication Instructions:   No changes *If you need a refill on your cardiac medications before your next appointment, please call your pharmacy*   Lab Work:  Not needed   Testing/Procedures:  Continue with  wearing the Monitor till complete   Follow-Up: At [facility], you and your health needs are our priority.  As part of our continuing mission to provide you with exceptional heart care, we have created designated Provider Care Teams.  These Care Teams include your primary Cardiologist (physician) and Advanced Practice Providers (APPs -  Physician Assistants and Nurse Practitioners) who all work together to provide you with the care you need, when you need it.     Your next appointment:   Aug 29,2023  The format for your next appointment:   In Person  Provider:   None    Other Instructions

## 2022-03-19 ENCOUNTER — Encounter: Payer: Self-pay | Admitting: Cardiology

## 2022-03-19 DIAGNOSIS — I10 Essential (primary) hypertension: Secondary | ICD-10-CM | POA: Insufficient documentation

## 2022-03-19 DIAGNOSIS — E8881 Metabolic syndrome: Secondary | ICD-10-CM | POA: Insufficient documentation

## 2022-03-19 DIAGNOSIS — Z833 Family history of diabetes mellitus: Secondary | ICD-10-CM | POA: Insufficient documentation

## 2022-03-19 NOTE — Assessment & Plan Note (Signed)
She seems to be the only female and her family that does not have full-blown diabetes.  This is very interesting based on the fact that her last A1c was 5.9.  She would not qualify for diabetes care with Ozempic or Mounjaro.  Reginal Lutes would be a great option, unfortunately supply shortages she is unable to get it.  Discussed importance of dietary modification.

## 2022-03-19 NOTE — Assessment & Plan Note (Addendum)
She is obese with prediabetes, hypertension hypertriglyceridemia.  Unfortunately she is not able to get [facility], and her A1c is not 6.5 so therefore she would not qualify for Ozempic or Mounjaro both of which would help with pre-DM but also with weight loss, triglycerides and overall cardiovascular benefit.  She is on a relatively interesting regimen for blood pressure losartan and verapamil. -=> She is having palpitations despite being on verapamil therefore would probably consider converting to carvedilol.

## 2022-03-19 NOTE — Assessment & Plan Note (Addendum)
Which is really describing his heart palpitations more so than chest pain is not exertional, does not sound anginal in nature.  She does not have it while she is exercising.  Her initial evaluation was appropriately performed with a Zio patch monitor, unfortunately she presents here today prior to having the monitor done which means that I will have to wait for the results of the monitor to even know over dealing with.  I suspect she is having short little bursts of PAT.  What would probably be a reasonable choice going forward would be to wean off verapamil and use carvedilol for blood pressure and heart rate.  Would probably consider [facility] pharmacy team to help with this transition.  Report after the monitor is done we can consider further testing with Coronary Calcium Score for baseline cardiovascular risk stratification

## 2022-03-19 NOTE — Assessment & Plan Note (Signed)
Stable BP currently on losartan and verapamil.  Low threshold to consider switching from verapamil to carvedilol for rate control and more blood pressure control.

## 2022-04-08 DIAGNOSIS — R002 Palpitations: Secondary | ICD-10-CM | POA: Diagnosis not present

## 2022-04-08 DIAGNOSIS — I471 Supraventricular tachycardia: Secondary | ICD-10-CM | POA: Diagnosis not present

## 2022-04-08 DIAGNOSIS — I472 Ventricular tachycardia, unspecified: Secondary | ICD-10-CM | POA: Diagnosis not present

## 2022-04-10 DIAGNOSIS — Z780 Asymptomatic menopausal state: Secondary | ICD-10-CM | POA: Diagnosis not present

## 2022-04-10 DIAGNOSIS — Z01419 Encounter for gynecological examination (general) (routine) without abnormal findings: Secondary | ICD-10-CM | POA: Diagnosis not present

## 2022-04-10 DIAGNOSIS — Z130 Encounter for screening for diseases of the blood and blood-forming organs and certain disorders involving the immune mechanism: Secondary | ICD-10-CM | POA: Diagnosis not present

## 2022-04-10 DIAGNOSIS — Z1389 Encounter for screening for other disorder: Secondary | ICD-10-CM | POA: Diagnosis not present

## 2022-04-25 DIAGNOSIS — E669 Obesity, unspecified: Secondary | ICD-10-CM | POA: Diagnosis not present

## 2022-04-25 DIAGNOSIS — E8881 Metabolic syndrome: Secondary | ICD-10-CM | POA: Diagnosis not present

## 2022-04-25 DIAGNOSIS — R7303 Prediabetes: Secondary | ICD-10-CM | POA: Diagnosis not present

## 2022-04-25 DIAGNOSIS — I10 Essential (primary) hypertension: Secondary | ICD-10-CM | POA: Diagnosis not present

## 2022-05-02 ENCOUNTER — Other Ambulatory Visit (HOSPITAL_COMMUNITY): Payer: Self-pay | Admitting: General Surgery

## 2022-05-02 ENCOUNTER — Other Ambulatory Visit: Payer: Self-pay | Admitting: General Surgery

## 2022-05-02 DIAGNOSIS — E6609 Other obesity due to excess calories: Secondary | ICD-10-CM

## 2022-05-06 ENCOUNTER — Ambulatory Visit (HOSPITAL_COMMUNITY)
Admission: RE | Admit: 2022-05-06 | Discharge: 2022-05-06 | Disposition: A | Discharge status: Home / Self Care | Payer: Federal, State, Local not specified - PPO | Source: Ambulatory Visit | Attending: General Surgery | Admitting: General Surgery

## 2022-05-06 ENCOUNTER — Other Ambulatory Visit (HOSPITAL_COMMUNITY): Payer: Federal, State, Local not specified - PPO

## 2022-05-06 DIAGNOSIS — E6609 Other obesity due to excess calories: Secondary | ICD-10-CM | POA: Insufficient documentation

## 2022-05-06 DIAGNOSIS — K9589 Other complications of other bariatric procedure: Secondary | ICD-10-CM | POA: Diagnosis not present

## 2022-05-06 DIAGNOSIS — E669 Obesity, unspecified: Secondary | ICD-10-CM | POA: Diagnosis not present

## 2022-05-07 ENCOUNTER — Encounter: Payer: Self-pay | Admitting: Dietician

## 2022-05-07 ENCOUNTER — Encounter: Payer: Federal, State, Local not specified - PPO | Attending: General Surgery | Admitting: Dietician

## 2022-05-07 DIAGNOSIS — E669 Obesity, unspecified: Secondary | ICD-10-CM | POA: Diagnosis not present

## 2022-05-07 NOTE — Progress Notes (Signed)
Nutrition Assessment for Bariatric Surgery Medical Nutrition Therapy Appt Start Time: 8:38    End Time: 9:48  Patient was seen on 05/07/2022 for Pre-Operative Nutrition Assessment. Letter of approval faxed to [facility] bariatric surgery program coordinator on 05/07/2022.   Referral stated Supervised Weight Loss (SWL) visits needed: 3  Planned surgery: Sleeve Gastrectomy Pt expectation of surgery: make it through, healthier lifestyle Pt expectation of dietitian: learn to eat better    NUTRITION ASSESSMENT   Anthropometrics  Start weight at [facility]: 231.6 lbs (date: 05/07/2022)  Height: 65.5 in BMI: 37.95 kg/m2     Clinical  Medical hx: Anemia, Anxiety, Arthritis, hyperlipidemia, HTN, nerve/muscle disease Medications: Celebrex, verapamil, topiramate, metaxalone   Labs: hemoglobin 12.5 Notable signs/symptoms: hand shakes from shoulder damage. Any previous deficiencies? No  Micronutrient Nutrition Focused Physical Exam: Hair: No issues observed Eyes: No issues observed Mouth: No issues observed Neck: No issues observed Nails: No issues observed Skin: No issues observed  Lifestyle & Dietary Hx  Patient states she lives with her husband. The pt and husband performs the food shopping and the pt and husband prepares the meals. She reports that she typically skips or misses 14 out of 21 possible meals per week. She may have 3 meals per week that are take-out or at a restaurant.  Patient states she does not work. She denies binge eating and denies feeling shame and/or guilt after eating too much food.  She denies having used laxatives or vomiting to facilitate weight loss. Patient states she has not sought emotional/psychological support for eating issues. She admits to emotional eating during times of stress. She states that she knows the difference between hunger and thirst and can tell when she is full. Pt has a tremor in her right arm.  Her hand shakes constantly. Pt  states she tried working out, by walking, and gets discouraged.  Pt states she sees a Veterinary surgeon. Pt states she does not eat like a normal person, stating she prefers gummy candy and grapes.  Pt states if she goes out to eat she like all foods, like steak and shrimp, japanese, etc... Pt states she gets Cortizone shots in her shoulders, as well as her head/neck (for migraine) Pt states she has a pain doctor to help manage her pain. Pt states she does not like to waste food, and will save food from restaurants, stating she will eat on it for days. Pt states she feels guilty for being blessed to have food when others struggle. Pt states she has a great team of doctors, and support for her mental health.  Physical Activity: ADLs  Sleep Hygiene: duration and quality: labored sleep  Current Patient Perceived Stress Level as stated by pt on a scale of 1-10:  8-9      Stress Management Techniques: pray, read bible, exercises, singing gospel songs.  Fruit servings per week: 21 Non starchy vegetable servings per week: 1-2 Whole Grains per week: 0   24-Hr Dietary Recall First Meal: grapes Snack: gummy bears Second Meal: grapes Snack:  Third Meal: grapes Snack:  Beverages: water, apple juice  Alcoholic beverages per week: 0   Estimated Energy Needs Calories: 1600   NUTRITION DIAGNOSIS  Overweight/obesity ([place]-3.3) related to past poor dietary habits and physical inactivity as evidenced by patient w/ planned Sleeve Gastrectomy surgery following dietary guidelines for continued weight loss.    NUTRITION INTERVENTION  Nutrition counseling (C-1) and education (E-2) to facilitate bariatric surgery goals.  Educated pt on micronutrient deficiencies post  surgery and strategies to mitigate that risk   Pre-Op Goals Reviewed with the Patient Track food and beverage intake (pen and paper, MyFitness Pal, Baritastic app, etc.) Make healthy food choices while monitoring portion sizes Consume 3 meals  per day or try to eat every 3-5 hours Avoid concentrated sugars and fried foods Keep sugar & fat in the single digits per serving on food labels Practice CHEWING your food (aim for applesauce consistency) Practice not drinking 15 minutes before, during, and 30 minutes after each meal and snack Avoid all carbonated beverages (ex: soda, sparkling beverages)  Limit caffeinated beverages (ex: coffee, tea, energy drinks) Avoid all sugar-sweetened beverages (ex: regular soda, sports drinks)  Avoid alcohol  Aim for 64-100 ounces of FLUID daily (with at least half of fluid intake being plain water)  Aim for at least 60-80 grams of PROTEIN daily Look for a liquid protein source that contains ?15 g protein and ?5 g carbohydrate (ex: shakes, drinks, shots) Make a list of non-food related activities Physical activity is an important part of a healthy lifestyle so keep it moving! The goal is to reach 150 minutes of exercise per week, including cardiovascular and weight baring activity.  *Goals that are bolded indicate the pt would like to start working towards these  Handouts Provided Include  Bariatric Surgery handouts (Nutrition Visits, Pre-Op Goals, Protein Shakes, Vitamins & Minerals)  Learning Style & Readiness for Change Teaching method utilized: Visual & Auditory  Demonstrated degree of understanding via: Teach Back  Readiness Level: preparation Barriers to learning/adherence to lifestyle change: previous habits  RD's Notes for Next Visit Patient progress toward chosen goals.     MONITORING & EVALUATION Dietary intake, weekly physical activity, body weight, and pre-op goals reached at next nutrition visit.    Next Steps  Patient is to follow up at [facility] in one month for next SWL visit.

## 2022-05-14 ENCOUNTER — Encounter: Payer: Self-pay | Admitting: Cardiology

## 2022-05-14 ENCOUNTER — Ambulatory Visit: Payer: Federal, State, Local not specified - PPO | Attending: Cardiology | Admitting: Cardiology

## 2022-05-14 VITALS — BP 142/97 | HR 93 | Ht 65.5 in | Wt 233.0 lb

## 2022-05-14 DIAGNOSIS — R002 Palpitations: Secondary | ICD-10-CM | POA: Diagnosis not present

## 2022-05-14 DIAGNOSIS — I10 Essential (primary) hypertension: Secondary | ICD-10-CM | POA: Diagnosis not present

## 2022-05-14 DIAGNOSIS — E8881 Metabolic syndrome: Secondary | ICD-10-CM | POA: Diagnosis not present

## 2022-05-14 NOTE — Patient Instructions (Addendum)

## 2022-05-14 NOTE — Assessment & Plan Note (Signed)
BP is little high today, but she has yet to take her medications and took some allergy medicines that usually ramps her pressures up.  Would need to assess in an inpatient visit.  If we have room to treat blood pressure and she continues to have palpitations, then would probably consider beta-blocker such as carvedilol.
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
## 2022-05-14 NOTE — Assessment & Plan Note (Signed)
Discussed results of her monitor.  Pretty benign.  Very rare PACs and PVCs noted.  Short bursts of PVCs and atrial runs.  Interestingly the PVCs are different morphology or could just be different leads reporting them.  With this short duration and no symptoms and no other concerning issues to suggest CHF I do not think any further evaluation is warranted.  Certainly if she were to have more prolonged episodes then would probably consider beta-blocker inherently and even potentially consider stress test but based on the fact that she is doing better we will hold off on further testing.  We will see her back in a year to reassess.

## 2022-05-14 NOTE — Assessment & Plan Note (Signed)
Stressed importance of baseline wrist medication.  Her LDL is 86 is not bad.  BP needs to be monitored and likely STEMI control needs to be monitored with an A1c of 5.9.  Discussed importance of weight loss.  We will discuss in follow-up, depending on what happens with her bariatric evaluation, we could consider baseline risk stratification with coronary calcium scoring in the future visits.Dana Nichols

## 2022-05-15 DIAGNOSIS — E669 Obesity, unspecified: Secondary | ICD-10-CM | POA: Diagnosis not present

## 2022-05-28 ENCOUNTER — Encounter: Payer: Self-pay | Admitting: Dietician

## 2022-05-28 ENCOUNTER — Encounter: Payer: Federal, State, Local not specified - PPO | Attending: General Surgery | Admitting: Dietician

## 2022-05-28 DIAGNOSIS — E669 Obesity, unspecified: Secondary | ICD-10-CM | POA: Insufficient documentation

## 2022-05-28 NOTE — Progress Notes (Signed)
Supervised Weight Loss Visit Bariatric Nutrition Education Appt Start Time: 8:00    End Time: 8:23   1 out of 3 SWL Appointments   Planned surgery: Sleeve Gastrectomy Pt expectation of surgery: make it through, healthier lifestyle Pt expectation of dietitian: learn to eat better    NUTRITION ASSESSMENT   Anthropometrics  Start weight at [facility]: 231.6 lbs (date: 05/07/2022)  Height: 65.5 in BMI: 37.69 kg/m2     Clinical  Medical hx: Anemia, Anxiety, Arthritis, hyperlipidemia, HTN, nerve/muscle disease Medications: Celebrex, verapamil, topiramate, metaxalone   Labs: hemoglobin 12.5 Notable signs/symptoms: hand shakes from shoulder damage. Any previous deficiencies? No  Lifestyle & Dietary Hx  Pt states she likes white meat chicken, stating she eats it about every day. Pt states she helps with her mom a lot. Pt states she stopped eating gummy bears, but still eating the grapes.  Estimated daily fluid intake: 100+ oz Supplements: women's multivitamin and calcium Current average weekly physical activity: 3 workouts a day, 20 minutes to 45 minutes  24-Hr Dietary Recall First Meal: oatmeal and fruit Snack: grapes Second Meal: chicken white meat in salad Snack: grapes Third Meal: roasted or baked chicken and vegetables Snack: grapes or almonds or trail mix Beverages: water, apple juice, coke for migraines  Estimated Energy Needs Calories: 1600   NUTRITION DIAGNOSIS  Overweight/obesity ([place]-3.3) related to past poor dietary habits and physical inactivity as evidenced by patient w/ planned sleeve surgery following dietary guidelines for continued weight loss.   NUTRITION INTERVENTION  Nutrition counseling (C-1) and education (E-2) to facilitate bariatric surgery goals.  Pre-Op Goals Reviewed with the Patient Track food and beverage intake (pen and paper, MyFitness Pal, Baritastic app, etc.) Make healthy food choices while monitoring portion sizes Consume 3 meals per day  or try to eat every 3-5 hours Avoid concentrated sugars and fried foods Keep sugar & fat in the single digits per serving on food labels Practice CHEWING your food (aim for applesauce consistency) Practice not drinking 15 minutes before, during, and 30 minutes after each meal and snack Avoid all carbonated beverages (ex: soda, sparkling beverages)  Limit caffeinated beverages (ex: coffee, tea, energy drinks) Avoid all sugar-sweetened beverages (ex: regular soda, sports drinks)  Avoid alcohol  Aim for 64-100 ounces of FLUID daily (with at least half of fluid intake being plain water)  Aim for at least 60-80 grams of PROTEIN daily Look for a liquid protein source that contains ?15 g protein and ?5 g carbohydrate (ex: shakes, drinks, shots) Make a list of non-food related activities Physical activity is an important part of a healthy lifestyle so keep it moving! The goal is to reach 150 minutes of exercise per week, including cardiovascular and weight baring activity.  Pre-Op Goals Progress & New Goals Continue: Consume 3 meals per day or try to eat every 3-5 hours Continue: Avoid concentrated sugars New: look for complex carbohydrates in whole grain and whole foods.  Handouts Provided Include    Learning Style & Readiness for Change Teaching method utilized: Visual & Auditory  Demonstrated degree of understanding via: Teach Back  Readiness Level: preparation Barriers to learning/adherence to lifestyle change: previous habits  RD's Notes for next Visit  Progress toward patient chosen goals   MONITORING & EVALUATION Dietary intake, weekly physical activity, body weight, and pre-op goals in 1 month.   Next Steps  Patient is to return to [facility] in 1 month for next SWL visit.

## 2022-05-31 ENCOUNTER — Ambulatory Visit (INDEPENDENT_AMBULATORY_CARE_PROVIDER_SITE_OTHER): Payer: Federal, State, Local not specified - PPO | Admitting: Licensed Clinical Social Worker

## 2022-05-31 DIAGNOSIS — F251 Schizoaffective disorder, depressive type: Secondary | ICD-10-CM | POA: Diagnosis not present
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
## 2022-06-06 ENCOUNTER — Telehealth (HOSPITAL_COMMUNITY): Payer: Self-pay | Admitting: Licensed Clinical Social Worker

## 2022-06-06 ENCOUNTER — Ambulatory Visit (INDEPENDENT_AMBULATORY_CARE_PROVIDER_SITE_OTHER): Payer: Federal, State, Local not specified - PPO | Admitting: Licensed Clinical Social Worker

## 2022-06-06 ENCOUNTER — Encounter (HOSPITAL_COMMUNITY): Payer: Self-pay

## 2022-06-06 DIAGNOSIS — F251 Schizoaffective disorder, depressive type: Secondary | ICD-10-CM | POA: Diagnosis not present

## 2022-06-06 NOTE — Telephone Encounter (Signed)
The therapist attempts to reach [name] by phone when she does not show for her appointment by about 15 minutes after the hour; however, the woman who answers says, "she's not here."   Adam Phenix, [place], [place], [facility], [place] 06/06/2022
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
## 2022-06-06 NOTE — Plan of Care (Signed)
Dana Nichols gives verbal permission for this therapist to sign electronically on her behalf.  Problem: Schizoaffective Disorder, depressive type Goal:  Dana Nichols will experience a decrease in her symptoms of depression and anxiety as evidenced by having a PHQ-9 and GAD-7 of 4 or less and zero episodes of having to go in the closet and pray due to irritability from contact with her siblings. Outcome: Initial Goal: Dana Nichols will better be able to "deal with" her entities as evidenced by not having them telling her to kill herself on average two times per week based on self-report. Outcome: Initial Intervention: Therapist will utilize solution-focused and CBT interventions to assist Dana Nichols in identifying and changing thoughts and behaviors that contribute to her problems with her depression and anxiety.  Note: Discussed with Dana Nichols today.

## 2022-06-13 ENCOUNTER — Ambulatory Visit (INDEPENDENT_AMBULATORY_CARE_PROVIDER_SITE_OTHER): Payer: Federal, State, Local not specified - PPO | Admitting: Licensed Clinical Social Worker

## 2022-06-13 DIAGNOSIS — F251 Schizoaffective disorder, depressive type: Secondary | ICD-10-CM
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
## 2022-06-17 ENCOUNTER — Telehealth (HOSPITAL_COMMUNITY): Payer: Self-pay | Admitting: Licensed Clinical Social Worker

## 2022-06-17 NOTE — Telephone Encounter (Signed)
The therapist returns Dana Nichols's phone call confirming her identity via two identifiers. She was looking at her EOB and wanting to assure this therapist was getting paid. She was told that this therapist shows as being at [address] rather than [address] [employer].  She says that the claims did process as in-network and were paid. The therapist informs her that he used to work at an agency on [address]. He explains that [place] is likely billing via a group NPI rather than this therapist's individual NPI so as long as the claims show as in-network and are paid, then everything is as it should be.  Adam Phenix, [place], LCSW, [facility], [place] 06/17/2022

## 2022-06-20 ENCOUNTER — Ambulatory Visit (HOSPITAL_COMMUNITY): Payer: Federal, State, Local not specified - PPO | Admitting: Licensed Clinical Social Worker

## 2022-06-20 DIAGNOSIS — F251 Schizoaffective disorder, depressive type: Secondary | ICD-10-CM

## 2022-06-20 DIAGNOSIS — F4481 Dissociative identity disorder: Secondary | ICD-10-CM
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
## 2022-06-27 ENCOUNTER — Other Ambulatory Visit: Payer: Self-pay | Admitting: Family Medicine

## 2022-06-27 ENCOUNTER — Encounter: Payer: Self-pay | Admitting: Dietician

## 2022-06-27 ENCOUNTER — Encounter: Payer: Federal, State, Local not specified - PPO | Attending: General Surgery | Admitting: Dietician

## 2022-06-27 ENCOUNTER — Ambulatory Visit (HOSPITAL_COMMUNITY): Payer: Federal, State, Local not specified - PPO | Admitting: Licensed Clinical Social Worker

## 2022-06-27 ENCOUNTER — Ambulatory Visit: Payer: Federal, State, Local not specified - PPO | Admitting: Dietician

## 2022-06-27 DIAGNOSIS — F4481 Dissociative identity disorder: Secondary | ICD-10-CM

## 2022-06-27 DIAGNOSIS — E669 Obesity, unspecified: Secondary | ICD-10-CM | POA: Diagnosis not present

## 2022-06-27 DIAGNOSIS — F251 Schizoaffective disorder, depressive type: Secondary | ICD-10-CM

## 2022-06-27 DIAGNOSIS — Z1231 Encounter for screening mammogram for malignant neoplasm of breast: Secondary | ICD-10-CM
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
## 2022-07-01 ENCOUNTER — Ambulatory Visit (HOSPITAL_COMMUNITY): Payer: Federal, State, Local not specified - PPO | Admitting: Licensed Clinical Social Worker

## 2022-07-02 ENCOUNTER — Telehealth (HOSPITAL_COMMUNITY): Payer: Self-pay | Admitting: Licensed Clinical Social Worker

## 2022-07-02 NOTE — Telephone Encounter (Signed)
The therapist returns Dana Nichols's call confirming her identify via two identifiers. She says that she needs to cancel her remaining therapy appointments as she and her husband have taken on a lot of debt; however, she says that she can still get her medications. She requests that the front desk call her with any remaining balance.  The therapist makes her aware of the no cost services offered via the [facility] providing her the contact number as she expresses an interest in looking into them.  [address] [place], LCSW, [facility], LCAS 07/02/2022

## 2022-07-03 ENCOUNTER — Telehealth (HOSPITAL_COMMUNITY): Payer: Self-pay | Admitting: Licensed Clinical Social Worker

## 2022-07-03 NOTE — Telephone Encounter (Signed)
The therapist receives a call from [place] and confirms her identity via two identifiers. She says that she has not heard back from the desk about a balance and got a reminder call about her appointment tomorrow though got My Chart notices her appointments were cancelled.  The therapist confirms that her appointments have been cancelled so she will not face any no show charges and informs her that he will again message the Receptionist to call her concerning any balance she may have.  [address], [place], LCSW, [facility], LCAS 07/03/2022

## 2022-07-04 ENCOUNTER — Ambulatory Visit (HOSPITAL_COMMUNITY): Payer: Federal, State, Local not specified - PPO | Admitting: Licensed Clinical Social Worker

## 2022-07-11 ENCOUNTER — Ambulatory Visit (HOSPITAL_COMMUNITY): Payer: Federal, State, Local not specified - PPO | Admitting: Licensed Clinical Social Worker

## 2022-07-15 ENCOUNTER — Telehealth (HOSPITAL_COMMUNITY): Payer: Self-pay | Admitting: Licensed Clinical Social Worker

## 2022-07-15 NOTE — Telephone Encounter (Signed)
The therapist receives a voicemail from [place] saying that she is trying to pay her bill but cannot get through to anyone and questioning why the $100 she paid to the Receptionist is not showing on her account.  Thus, the therapist offers to call the [facility] Billing number and stay on the line with her until she connects with a Representative.   Dana Nichols is conference in; however, when the live Representative comes on the line, Dana Nichols does not respond and is apparently no longer on the call.  Thus, the therapist calls her back with Peter saying that she could not hear anything so perhaps needs to "reboot" her phone. She says that when she called before that she would get the count down to a live Representative and then only hear someone breathing.   The therapist confirms that she is selecting the correct options noting that it took about 8 minutes to get a live person on the phone.  Dana Nichols will reboot her phone and attempt the call again and is to call this therapist back if she still has problems with the therapist offering to conference the call once more.  [address] [place], LCSW, [facility], LCAS 07/15/2022

## 2022-07-16 NOTE — Telephone Encounter (Signed)
Note in error.

## 2022-07-18 ENCOUNTER — Ambulatory Visit (HOSPITAL_COMMUNITY): Payer: Federal, State, Local not specified - PPO | Admitting: Licensed Clinical Social Worker

## 2022-07-25 IMAGING — MG MM DIGITAL SCREENING BILAT W/ TOMO AND CAD
8 series · 8 of 24 positions shown · non-contrast
Comparison: Previous exam(s).

CLINICAL DATA: Screening.

EXAM:
DIGITAL SCREENING BILATERAL MAMMOGRAM WITH TOMOSYNTHESIS AND CAD
TECHNIQUE: Bilateral screening digital craniocaudal and mediolateral oblique
mammograms were obtained. Bilateral screening digital breast
tomosynthesis was performed. The images were evaluated with
computer-aided detection.

[R CC synth-2D]
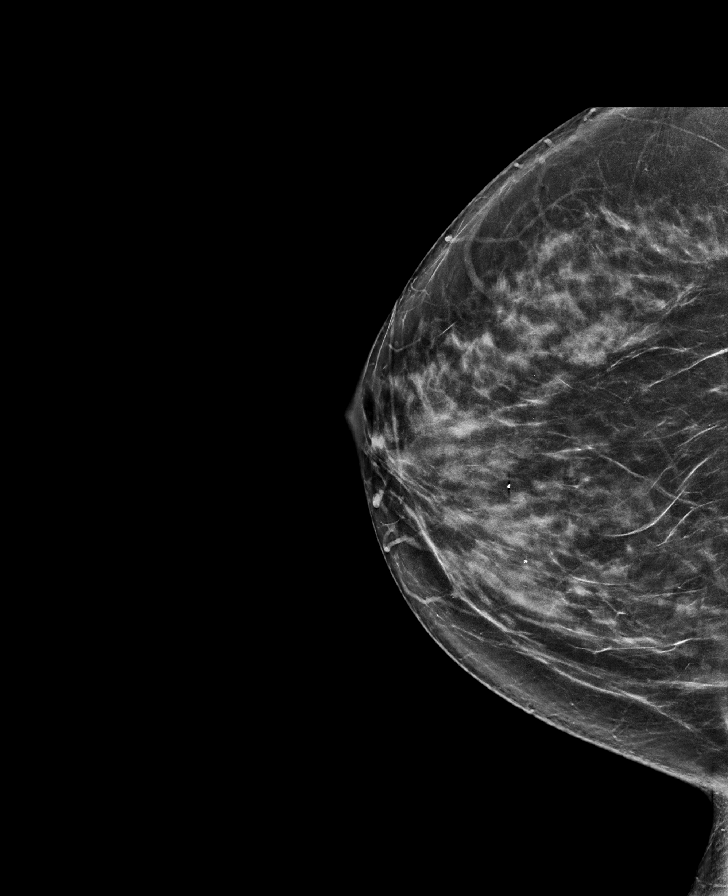

[L MLO synth-2D]
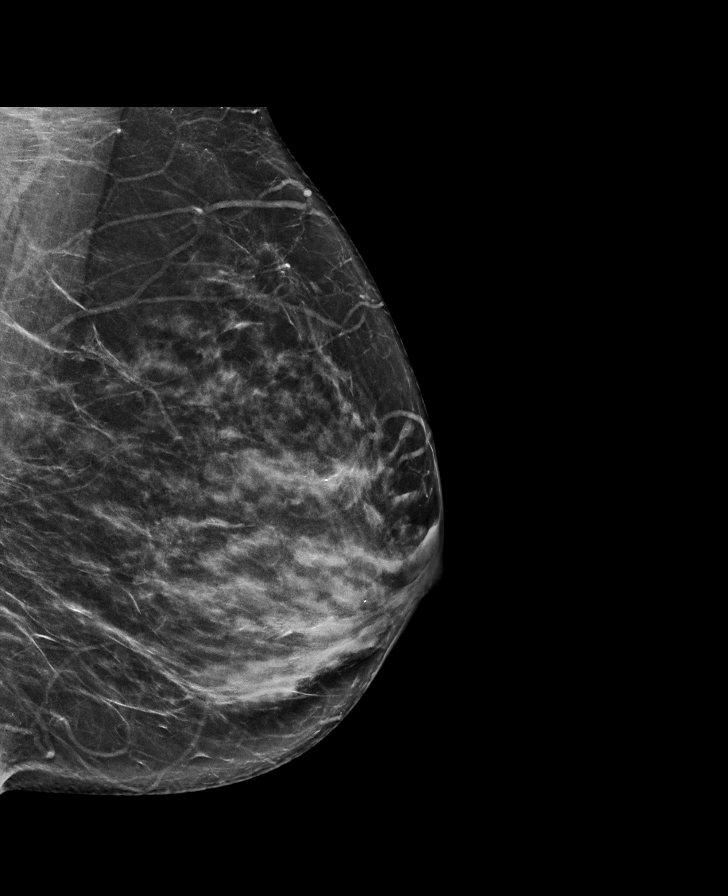

[L CC synth-2D]
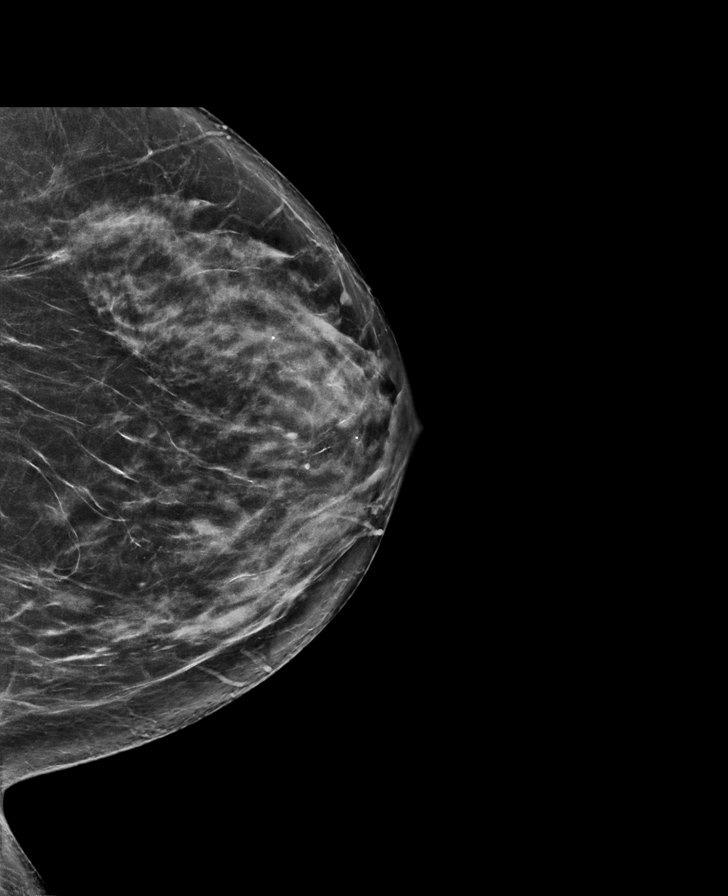

[R MLO synth-2D]
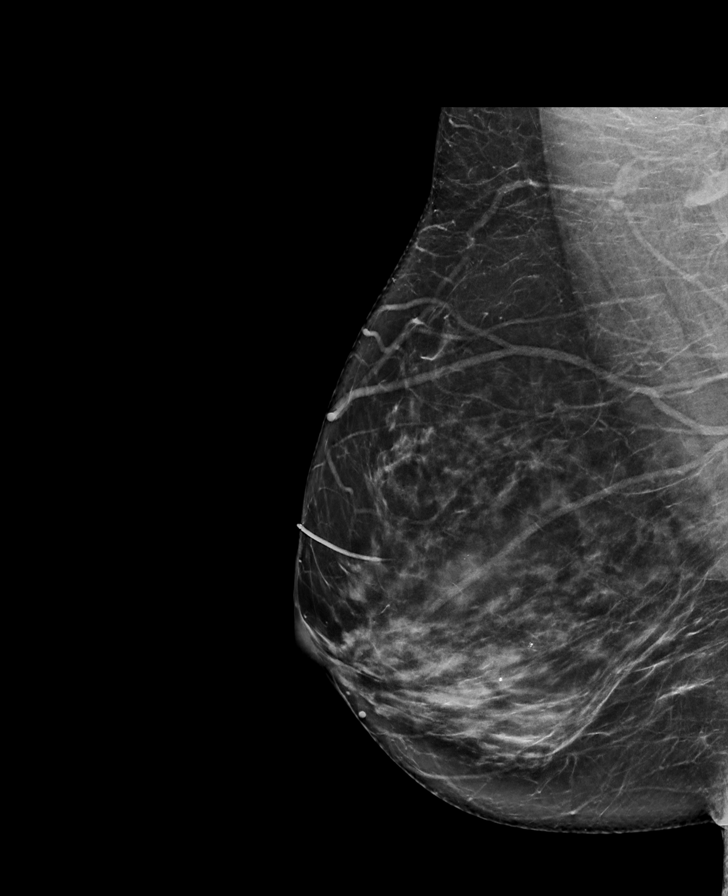

[R MLO tomo · tomo slice 42/83.0]
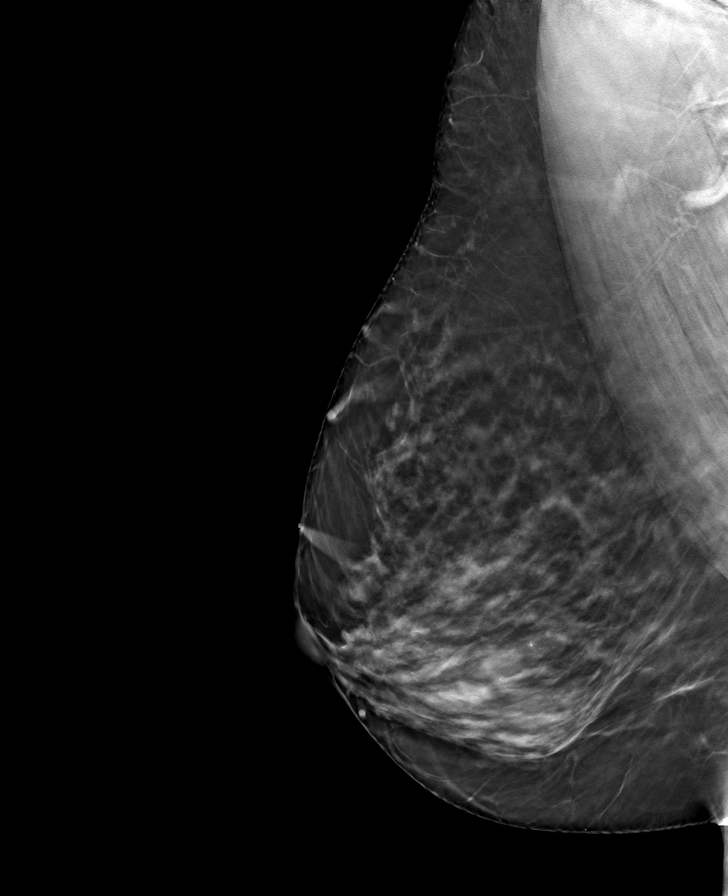

[L MLO tomo · tomo slice 39/76.0]
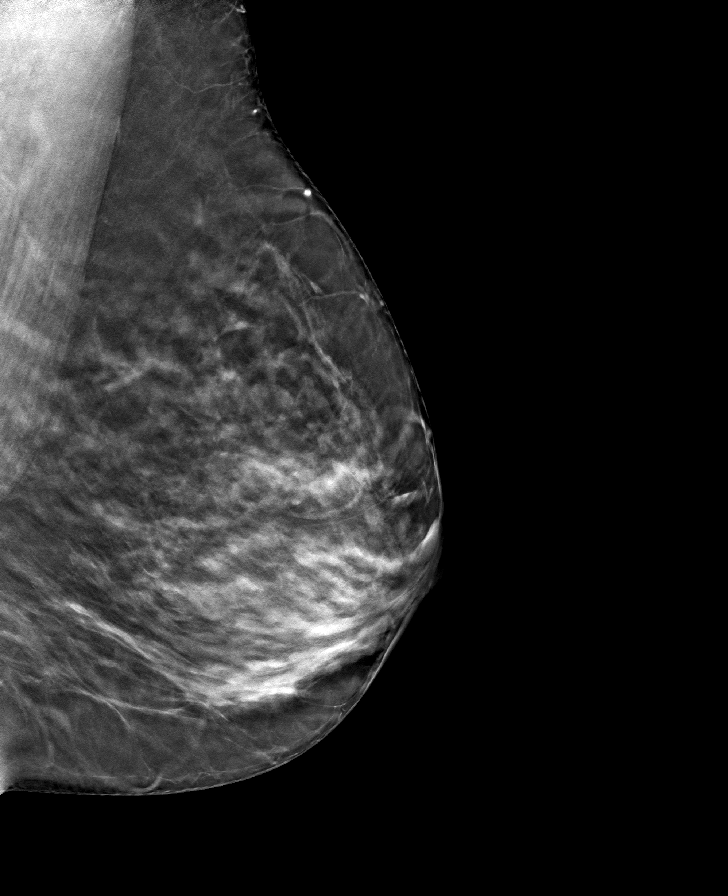

[R CC tomo · tomo slice 38/75.0]
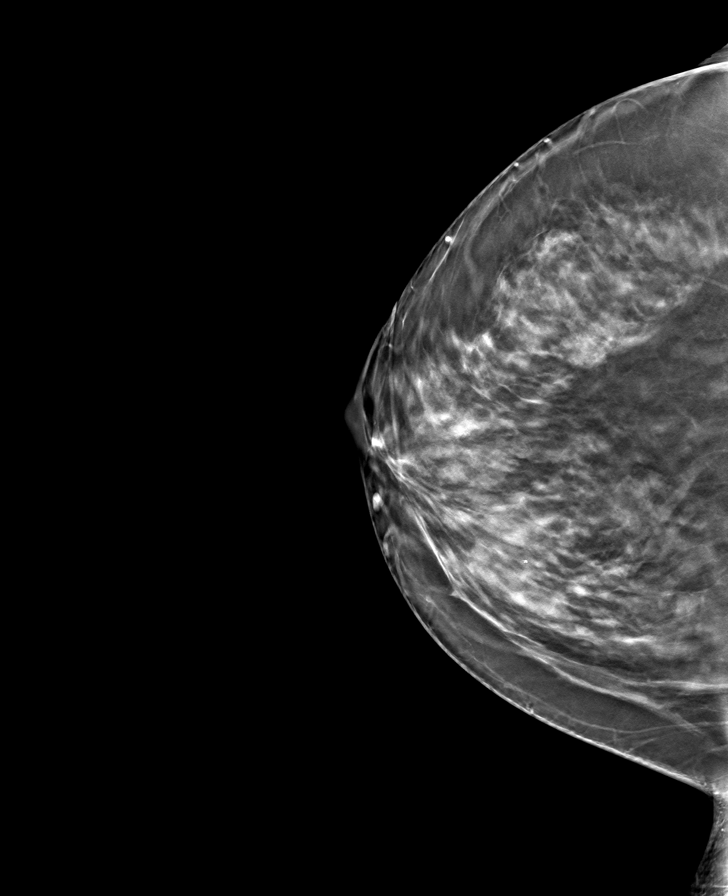

[L CC tomo · tomo slice 39/77.0]
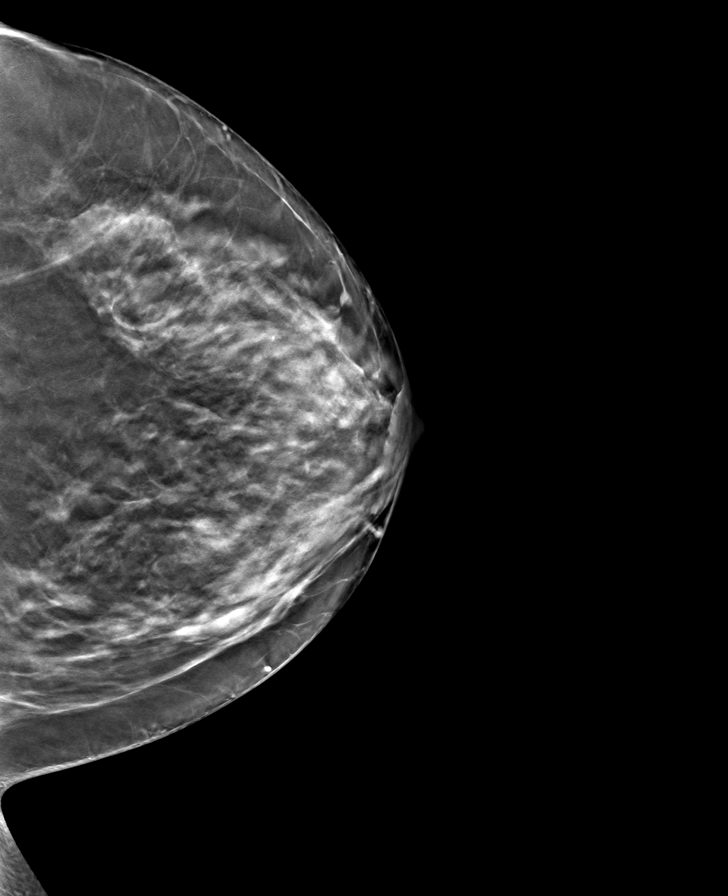

[8 of 24 positions shown; findings below may reference images not displayed]

ACR Breast Density Category c: The breast tissue is heterogeneously
dense, which may obscure small masses.
FINDINGS: There are no findings suspicious for malignancy.
IMPRESSION: No mammographic evidence of malignancy. A result letter of this
screening mammogram will be mailed directly to the patient.

RECOMMENDATION:
Screening mammogram in one year. (Code:[id number])

BI-RADS CATEGORY  1: Negative.

## 2022-07-29 ENCOUNTER — Encounter: Payer: Self-pay | Admitting: Dietician

## 2022-07-29 ENCOUNTER — Encounter: Payer: Federal, State, Local not specified - PPO | Attending: General Surgery | Admitting: Dietician

## 2022-07-29 DIAGNOSIS — E669 Obesity, unspecified: Secondary | ICD-10-CM | POA: Diagnosis not present
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
## 2022-08-05 ENCOUNTER — Ambulatory Visit
Admission: RE | Admit: 2022-08-05 | Discharge: 2022-08-05 | Disposition: A | Discharge status: Home / Self Care | Payer: Federal, State, Local not specified - PPO | Source: Ambulatory Visit | Attending: Family Medicine | Admitting: Family Medicine

## 2022-08-05 DIAGNOSIS — Z1231 Encounter for screening mammogram for malignant neoplasm of breast: Secondary | ICD-10-CM

## 2022-11-14 DIAGNOSIS — M7542 Impingement syndrome of left shoulder: Secondary | ICD-10-CM | POA: Diagnosis not present

## 2022-11-22 DIAGNOSIS — D649 Anemia, unspecified: Secondary | ICD-10-CM | POA: Diagnosis not present

## 2022-11-22 DIAGNOSIS — E782 Mixed hyperlipidemia: Secondary | ICD-10-CM | POA: Diagnosis not present

## 2022-11-22 DIAGNOSIS — R7303 Prediabetes: Secondary | ICD-10-CM | POA: Diagnosis not present

## 2022-11-22 DIAGNOSIS — R410 Disorientation, unspecified: Secondary | ICD-10-CM | POA: Diagnosis not present

## 2022-11-22 DIAGNOSIS — I10 Essential (primary) hypertension: Secondary | ICD-10-CM | POA: Diagnosis not present

## 2023-04-15 DIAGNOSIS — Z1231 Encounter for screening mammogram for malignant neoplasm of breast: Secondary | ICD-10-CM | POA: Diagnosis not present

## 2023-04-15 DIAGNOSIS — Z01419 Encounter for gynecological examination (general) (routine) without abnormal findings: Secondary | ICD-10-CM | POA: Diagnosis not present

## 2023-04-15 DIAGNOSIS — Z130 Encounter for screening for diseases of the blood and blood-forming organs and certain disorders involving the immune mechanism: Secondary | ICD-10-CM | POA: Diagnosis not present

## 2023-04-15 DIAGNOSIS — M25552 Pain in left hip: Secondary | ICD-10-CM | POA: Diagnosis not present

## 2023-04-18 DIAGNOSIS — M5432 Sciatica, left side: Secondary | ICD-10-CM | POA: Diagnosis not present

## 2023-04-21 DIAGNOSIS — N951 Menopausal and female climacteric states: Secondary | ICD-10-CM | POA: Diagnosis not present

## 2023-04-22 DIAGNOSIS — M5432 Sciatica, left side: Secondary | ICD-10-CM | POA: Diagnosis not present

## 2023-04-30 DIAGNOSIS — M25552 Pain in left hip: Secondary | ICD-10-CM | POA: Diagnosis not present

## 2023-04-30 DIAGNOSIS — M545 Low back pain, unspecified: Secondary | ICD-10-CM | POA: Diagnosis not present

## 2023-05-15 ENCOUNTER — Inpatient Hospital Stay (HOSPITAL_COMMUNITY): Payer: Federal, State, Local not specified - PPO | Admitting: Anesthesiology

## 2023-05-15 ENCOUNTER — Inpatient Hospital Stay (HOSPITAL_COMMUNITY)
Admission: AD | Admit: 2023-05-15 | Discharge: 2023-05-18 | DRG: 482 | Disposition: A | Discharge status: Home Health | Payer: Federal, State, Local not specified - PPO | Source: Ambulatory Visit | Attending: Internal Medicine | Admitting: Internal Medicine

## 2023-05-15 ENCOUNTER — Inpatient Hospital Stay (HOSPITAL_COMMUNITY): Payer: Federal, State, Local not specified - PPO

## 2023-05-15 ENCOUNTER — Encounter (HOSPITAL_COMMUNITY): Payer: Self-pay

## 2023-05-15 ENCOUNTER — Ambulatory Visit: Payer: Self-pay | Admitting: Emergency Medicine

## 2023-05-15 DIAGNOSIS — R0989 Other specified symptoms and signs involving the circulatory and respiratory systems: Secondary | ICD-10-CM | POA: Diagnosis not present

## 2023-05-15 DIAGNOSIS — K589 Irritable bowel syndrome without diarrhea: Secondary | ICD-10-CM | POA: Diagnosis not present

## 2023-05-15 DIAGNOSIS — Z91040 Latex allergy status: Secondary | ICD-10-CM | POA: Diagnosis not present

## 2023-05-15 DIAGNOSIS — W19XXXA Unspecified fall, initial encounter: Secondary | ICD-10-CM | POA: Diagnosis present

## 2023-05-15 DIAGNOSIS — I160 Hypertensive urgency: Secondary | ICD-10-CM | POA: Diagnosis not present

## 2023-05-15 DIAGNOSIS — E781 Pure hyperglyceridemia: Secondary | ICD-10-CM | POA: Diagnosis not present

## 2023-05-15 DIAGNOSIS — I10 Essential (primary) hypertension: Secondary | ICD-10-CM | POA: Diagnosis not present

## 2023-05-15 DIAGNOSIS — S72002A Fracture of unspecified part of neck of left femur, initial encounter for closed fracture: Secondary | ICD-10-CM | POA: Diagnosis not present

## 2023-05-15 DIAGNOSIS — E8881 Metabolic syndrome: Secondary | ICD-10-CM | POA: Diagnosis not present

## 2023-05-15 DIAGNOSIS — S72001A Fracture of unspecified part of neck of right femur, initial encounter for closed fracture: Secondary | ICD-10-CM | POA: Diagnosis not present

## 2023-05-15 DIAGNOSIS — G8918 Other acute postprocedural pain: Secondary | ICD-10-CM | POA: Diagnosis not present

## 2023-05-15 DIAGNOSIS — F419 Anxiety disorder, unspecified: Secondary | ICD-10-CM | POA: Diagnosis not present

## 2023-05-15 DIAGNOSIS — G8929 Other chronic pain: Secondary | ICD-10-CM | POA: Diagnosis not present

## 2023-05-15 DIAGNOSIS — Z6839 Body mass index (BMI) 39.0-39.9, adult: Secondary | ICD-10-CM | POA: Diagnosis not present

## 2023-05-15 DIAGNOSIS — Z8249 Family history of ischemic heart disease and other diseases of the circulatory system: Secondary | ICD-10-CM

## 2023-05-15 DIAGNOSIS — G8321 Monoplegia of upper limb affecting right dominant side: Secondary | ICD-10-CM | POA: Diagnosis not present

## 2023-05-15 DIAGNOSIS — M797 Fibromyalgia: Secondary | ICD-10-CM | POA: Diagnosis present

## 2023-05-15 DIAGNOSIS — E669 Obesity, unspecified: Secondary | ICD-10-CM | POA: Diagnosis present

## 2023-05-15 DIAGNOSIS — E785 Hyperlipidemia, unspecified: Secondary | ICD-10-CM

## 2023-05-15 DIAGNOSIS — Z888 Allergy status to other drugs, medicaments and biological substances status: Secondary | ICD-10-CM | POA: Diagnosis not present

## 2023-05-15 DIAGNOSIS — Z91048 Other nonmedicinal substance allergy status: Secondary | ICD-10-CM | POA: Diagnosis not present

## 2023-05-15 DIAGNOSIS — A6009 Herpesviral infection of other urogenital tract: Secondary | ICD-10-CM | POA: Diagnosis present

## 2023-05-15 DIAGNOSIS — Z7985 Long-term (current) use of injectable non-insulin antidiabetic drugs: Secondary | ICD-10-CM | POA: Diagnosis not present

## 2023-05-15 DIAGNOSIS — Z818 Family history of other mental and behavioral disorders: Secondary | ICD-10-CM

## 2023-05-15 DIAGNOSIS — S7292XA Unspecified fracture of left femur, initial encounter for closed fracture: Secondary | ICD-10-CM | POA: Diagnosis not present

## 2023-05-15 DIAGNOSIS — M25552 Pain in left hip: Secondary | ICD-10-CM | POA: Diagnosis not present

## 2023-05-15 DIAGNOSIS — Z885 Allergy status to narcotic agent status: Secondary | ICD-10-CM | POA: Diagnosis not present

## 2023-05-15 DIAGNOSIS — Z833 Family history of diabetes mellitus: Secondary | ICD-10-CM

## 2023-05-15 DIAGNOSIS — Z79899 Other long term (current) drug therapy: Secondary | ICD-10-CM | POA: Diagnosis not present

## 2023-05-15 DIAGNOSIS — S7290XA Unspecified fracture of unspecified femur, initial encounter for closed fracture: Principal | ICD-10-CM | POA: Diagnosis present

## 2023-05-15 DIAGNOSIS — Z01810 Encounter for preprocedural cardiovascular examination: Secondary | ICD-10-CM | POA: Diagnosis not present

## 2023-05-15 LAB — COMPREHENSIVE METABOLIC PANEL
ALT: 18 U/L (ref 0–44)
AST: 14 U/L — ABNORMAL LOW (ref 15–41)
Albumin: 3.7 g/dL (ref 3.5–5.0)
Alkaline Phosphatase: 114 U/L (ref 38–126)
Anion gap: 9 (ref 5–15)
BUN: 21 mg/dL (ref 8–23)
CO2: 26 mmol/L (ref 22–32)
Calcium: 9.4 mg/dL (ref 8.9–10.3)
Chloride: 105 mmol/L (ref 98–111)
Creatinine, Ser: 0.73 mg/dL (ref 0.44–1.00)
GFR, Estimated: >60 mL/min (ref >60)
Glucose, Bld: 92 mg/dL (ref 70–99)
Potassium: 4.1 mmol/L (ref 3.5–5.1)
Sodium: 140 mmol/L (ref 135–145)
Total Bilirubin: 0.7 mg/dL (ref 0.3–1.2)
Total Protein: 7.6 g/dL (ref 6.5–8.1)

## 2023-05-15 LAB — CBC
HCT: 38 % (ref 36.0–46.0)
Hemoglobin: 11.8 g/dL — ABNORMAL LOW (ref 12.0–15.0)
MCH: 28.4 pg (ref 26.0–34.0)
MCHC: 31.1 g/dL (ref 30.0–36.0)
MCV: 91.6 fL (ref 80.0–100.0)
Platelets: 289 10*3/uL (ref 150–400)
RBC: 4.15 MIL/uL (ref 3.87–5.11)
RDW: 15.1 % (ref 11.5–15.5)
WBC: 9.3 10*3/uL (ref 4.0–10.5)
nRBC: 0 % (ref 0.0–0.2)

## 2023-05-15 LAB — PROTIME-INR
INR: 1 (ref 0.8–1.2)
Prothrombin Time: 13.3 seconds (ref 11.4–15.2)

## 2023-05-15 LAB — APTT: aPTT: 26 seconds (ref 24–36)

## 2023-05-15 MED ORDER — SEMAGLUTIDE-WEIGHT MANAGEMENT 0.25 MG/0.5ML ~~LOC~~ SOAJ: 0.2500 mg | SUBCUTANEOUS | Status: DC

## 2023-05-15 MED ORDER — METAXALONE 800 MG PO TABS: 800.0000 mg | ORAL_TABLET | Freq: Three times a day (TID) | ORAL | Status: DC | PRN

## 2023-05-15 MED ORDER — TRAZODONE HCL 50 MG PO TABS
25.0000 mg | ORAL_TABLET | Freq: Every evening | ORAL | Status: DC | PRN
  Filled 2023-05-15: qty 1

## 2023-05-15 MED ORDER — MAGNESIUM HYDROXIDE 400 MG/5ML PO SUSP: 30.0000 mL | Freq: Every day | ORAL | Status: DC | PRN

## 2023-05-15 MED ORDER — GABAPENTIN 300 MG PO CAPS
600.0000 mg | ORAL_CAPSULE | Freq: Three times a day (TID) | ORAL | Status: DC
  Administered 2023-05-15 – 2023-05-18 (×7): 600 mg via ORAL
  Filled 2023-05-15 (×7): qty 2

## 2023-05-15 MED ORDER — VITAMIN B-12 1000 MCG PO TABS
1000.0000 ug | ORAL_TABLET | Freq: Every day | ORAL | Status: DC
  Administered 2023-05-15 – 2023-05-18 (×3): 1000 ug via ORAL
  Filled 2023-05-15 (×3): qty 1

## 2023-05-15 MED ORDER — ROPIVACAINE HCL 5 MG/ML IJ SOLN
INTRAMUSCULAR | Status: DC | PRN
  Administered 2023-05-15: 30 mL via PERINEURAL

## 2023-05-15 MED ORDER — ADULT MULTIVITAMIN W/MINERALS CH
1.0000 | ORAL_TABLET | Freq: Every day | ORAL | Status: DC
  Administered 2023-05-17 – 2023-05-18 (×2): 1 via ORAL
  Filled 2023-05-15 (×2): qty 1

## 2023-05-15 MED ORDER — ROSUVASTATIN CALCIUM 5 MG PO TABS
5.0000 mg | ORAL_TABLET | ORAL | Status: DC
  Administered 2023-05-17: 5 mg via ORAL
  Filled 2023-05-15 (×3): qty 1

## 2023-05-15 MED ORDER — VERAPAMIL HCL ER 240 MG PO TBCR
240.0000 mg | EXTENDED_RELEASE_TABLET | Freq: Every day | ORAL | Status: DC
  Administered 2023-05-18: 240 mg via ORAL
  Filled 2023-05-15 (×4): qty 1

## 2023-05-15 MED ORDER — FENTANYL CITRATE (PF) 100 MCG/2ML IJ SOLN
INTRAMUSCULAR | Status: DC | PRN
  Administered 2023-05-15: 50 ug via INTRAVENOUS

## 2023-05-15 MED ORDER — CHLORHEXIDINE GLUCONATE 4 % EX SOLN: 60.0000 mL | Freq: Once | CUTANEOUS | Status: DC

## 2023-05-15 MED ORDER — ACETAMINOPHEN 325 MG PO TABS: 650.0000 mg | ORAL_TABLET | ORAL | Status: DC | PRN

## 2023-05-15 MED ORDER — POVIDONE-IODINE 10 % EX SWAB
2.0000 | Freq: Once | CUTANEOUS | Status: AC
  Administered 2023-05-16: 2 via TOPICAL

## 2023-05-15 MED ORDER — CEFAZOLIN SODIUM-DEXTROSE 2-4 GM/100ML-% IV SOLN
2.0000 g | INTRAVENOUS | Status: AC
  Administered 2023-05-16: 2 g via INTRAVENOUS
  Filled 2023-05-15: qty 100

## 2023-05-15 MED ORDER — MORPHINE SULFATE (PF) 2 MG/ML IV SOLN
0.5000 mg | INTRAVENOUS | Status: DC | PRN
  Administered 2023-05-16: 0.5 mg via INTRAVENOUS
  Filled 2023-05-15: qty 1

## 2023-05-15 MED ORDER — SODIUM CHLORIDE 0.9 % IV SOLN: INTRAVENOUS | Status: DC

## 2023-05-15 MED ORDER — FEZOLINETANT 45 MG PO TABS: 45.0000 mg | ORAL_TABLET | Freq: Every day | ORAL | Status: DC

## 2023-05-15 MED ORDER — DEXAMETHASONE SODIUM PHOSPHATE 4 MG/ML IJ SOLN
INTRAMUSCULAR | Status: DC | PRN
  Administered 2023-05-15: 10 mg via PERINEURAL

## 2023-05-15 MED ORDER — CLONIDINE HCL (ANALGESIA) 100 MCG/ML EP SOLN
EPIDURAL | Status: DC | PRN
  Administered 2023-05-15: 100 ug

## 2023-05-15 MED ORDER — LABETALOL HCL 5 MG/ML IV SOLN
20.0000 mg | INTRAVENOUS | Status: DC | PRN
  Filled 2023-05-15: qty 4

## 2023-05-15 MED ORDER — FENTANYL CITRATE PF 50 MCG/ML IJ SOSY
PREFILLED_SYRINGE | INTRAMUSCULAR | Status: AC
  Filled 2023-05-15: qty 1

## 2023-05-15 MED ORDER — LOSARTAN POTASSIUM 25 MG PO TABS: 25.0000 mg | ORAL_TABLET | Freq: Every day | ORAL | Status: DC

## 2023-05-15 MED ORDER — HYDRALAZINE HCL 20 MG/ML IJ SOLN
10.0000 mg | Freq: Four times a day (QID) | INTRAMUSCULAR | Status: DC | PRN
  Administered 2023-05-16: 10 mg via INTRAVENOUS
  Filled 2023-05-15: qty 1

## 2023-05-15 MED ORDER — VALACYCLOVIR HCL 500 MG PO TABS
500.0000 mg | ORAL_TABLET | Freq: Two times a day (BID) | ORAL | Status: DC
  Administered 2023-05-16 – 2023-05-18 (×4): 500 mg via ORAL
  Filled 2023-05-15 (×6): qty 1

## 2023-05-15 MED ORDER — ONDANSETRON HCL 4 MG/2ML IJ SOLN: 4.0000 mg | INTRAMUSCULAR | Status: DC | PRN

## 2023-05-15 MED ORDER — HYDROCODONE-ACETAMINOPHEN 5-325 MG PO TABS
1.0000 | ORAL_TABLET | Freq: Four times a day (QID) | ORAL | Status: DC | PRN
  Administered 2023-05-15 – 2023-05-16 (×2): 2 via ORAL
  Administered 2023-05-17: 1 via ORAL
  Administered 2023-05-18: 2 via ORAL
  Filled 2023-05-15 (×2): qty 2
  Filled 2023-05-15: qty 1
  Filled 2023-05-15: qty 2

## 2023-05-15 MED ORDER — DULOXETINE HCL 60 MG PO CPEP
60.0000 mg | ORAL_CAPSULE | Freq: Two times a day (BID) | ORAL | Status: DC
  Administered 2023-05-15 – 2023-05-18 (×6): 60 mg via ORAL
  Filled 2023-05-15 (×8): qty 1

## 2023-05-15 NOTE — Assessment & Plan Note (Signed)
-   The patient is directly admitted to the medical-surgical bed. - Pain management will be provided. - She will be kept n.p.o. after midnight. - Dr. Junie Spencer is planning for operative intervention in the next 24 to 48 hours. - The patient has no history of CHF, CVA, CAD, diabetes mellitus on insulin or renal failure with a creatinine of more than 2.  She is considered at average risk for her age for perioperative cardiovascular events.  The revised cardiac risk index.  She has no current pulmonary issues.

## 2023-05-15 NOTE — Anesthesia Procedure Notes (Signed)
Anesthesia Regional Block: Femoral nerve block (+peng)   Pre-Anesthetic Checklist: , timeout performed,  Correct Patient, Correct Site, Correct Laterality,  Correct Procedure, Correct Position, site marked,  Risks and benefits discussed,  Surgical consent,  Pre-op evaluation,  At surgeon's request and post-op pain management  Laterality: Lower and Left  Prep: chloraprep       Needles:  Injection technique: Single-shot  Needle Type: Stimulator Needle - 80     Needle Length: 9cm  Needle Gauge: 22   Needle insertion depth: 6 cm   Additional Needles:   Procedures:,,,, ultrasound used (permanent image in chart),,    Narrative:  Start time: 05/15/2023 7:47 PM End time: 05/15/2023 8:07 PM Injection made incrementally with aspirations every 5 mL.  Performed by: Personally  Anesthesiologist: Lewie Loron, MD  Additional Notes: BP cuff, EKG monitors applied. Sedation begun. Femoral artery palpated for location of nerve. After nerve location verified with U/S, anesthetic injected incrementally, slowly, and after negative aspirations under direct u/s guidance. Good perineural spread. Patient tolerated well.

## 2023-05-15 NOTE — Progress Notes (Signed)
Pt stable throughout pacu stay for femoral block placement.  Time out completed prior to procedure. Procedure completed at 20:14. Per [place] MD hold patient 10 minutes and if she remains stable she can return to room upstairs.  Report called to [facility] on 3W.

## 2023-05-15 NOTE — Assessment & Plan Note (Signed)
-   Will continue Cymbalta and Neurontin.

## 2023-05-15 NOTE — Anesthesia Postprocedure Evaluation (Signed)
Anesthesia Post Note  Patient: Dana Nichols  Procedure(s) Performed: AN AD HOC NERVE BLOCK     Patient location during evaluation: PACU Anesthesia Type: Regional Level of consciousness: awake Pain management: pain level controlled Vital Signs Assessment: post-procedure vital signs reviewed and stable Respiratory status: spontaneous breathing Cardiovascular status: stable Anesthetic complications: no   No notable events documented.  Last Vitals:  Vitals:   05/15/23 1824 05/15/23 1940  BP: (!) 151/94 126/87  Pulse: 75 91  Resp:  18  Temp:  36.8 C  SpO2: 98% 100%    Last Pain:  Vitals:   05/15/23 1940  TempSrc: Oral                 Lewie Loron
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
## 2023-05-15 NOTE — Assessment & Plan Note (Signed)
-   The patient will be placed on as needed IV labetalol and hydralazine. - Will continue her antihypertensive therapy.

## 2023-05-15 NOTE — Assessment & Plan Note (Signed)
Will continue statin therapy. ?
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
## 2023-05-15 NOTE — Anesthesia Preprocedure Evaluation (Addendum)
Anesthesia Evaluation  Patient identified by MRN, date of birth, ID band Patient awake    Reviewed: Allergy & Precautions, NPO status , Patient's Chart, lab work & pertinent test results  Airway Mallampati: II  TM Distance: >3 FB Neck ROM: Full    Dental no notable dental hx. (+) Teeth Intact   Pulmonary neg pulmonary ROS   Pulmonary exam normal breath sounds clear to auscultation       Cardiovascular hypertension, Pt. on medications Normal cardiovascular exam Rhythm:Regular Rate:Normal     Neuro/Psych  Headaches PSYCHIATRIC DISORDERS Anxiety        GI/Hepatic negative GI ROS, Neg liver ROS,,,  Endo/Other  negative endocrine ROS    Renal/GU negative Renal ROS     Musculoskeletal  (+) Arthritis ,  Fibromyalgia -  Abdominal   Peds  Hematology  (+) Blood dyscrasia, anemia   Anesthesia Other Findings   Reproductive/Obstetrics                             Anesthesia Physical Anesthesia Plan  ASA: 2  Anesthesia Plan: Regional   Post-op Pain Management:    Induction:   PONV Risk Score and Plan:   Airway Management Planned:   Additional Equipment:   Intra-op Plan:   Post-operative Plan:   Informed Consent: I have reviewed the patients History and Physical, chart, labs and discussed the procedure including the risks, benefits and alternatives for the proposed anesthesia with the patient or authorized representative who has indicated his/her understanding and acceptance.       Plan Discussed with:   Anesthesia Plan Comments:        Anesthesia Quick Evaluation
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
## 2023-05-16 ENCOUNTER — Other Ambulatory Visit: Payer: Self-pay

## 2023-05-16 ENCOUNTER — Inpatient Hospital Stay (HOSPITAL_COMMUNITY): Payer: Federal, State, Local not specified - PPO

## 2023-05-16 ENCOUNTER — Inpatient Hospital Stay (HOSPITAL_COMMUNITY): Payer: Federal, State, Local not specified - PPO | Admitting: Anesthesiology

## 2023-05-16 ENCOUNTER — Encounter (HOSPITAL_COMMUNITY): Payer: Self-pay | Admitting: Family Medicine

## 2023-05-16 ENCOUNTER — Encounter (HOSPITAL_COMMUNITY)
Admission: AD | Disposition: A | Discharge status: Home Health | Payer: Self-pay | Source: Ambulatory Visit | Attending: Internal Medicine

## 2023-05-16 DIAGNOSIS — S72042A Displaced fracture of base of neck of left femur, initial encounter for closed fracture: Secondary | ICD-10-CM | POA: Diagnosis not present

## 2023-05-16 DIAGNOSIS — S7292XA Unspecified fracture of left femur, initial encounter for closed fracture: Secondary | ICD-10-CM | POA: Diagnosis not present

## 2023-05-16 DIAGNOSIS — S72002A Fracture of unspecified part of neck of left femur, initial encounter for closed fracture: Secondary | ICD-10-CM | POA: Diagnosis not present

## 2023-05-16 DIAGNOSIS — Z96642 Presence of left artificial hip joint: Secondary | ICD-10-CM | POA: Diagnosis not present

## 2023-05-16 HISTORY — PX: PERCUTANEOUS PINNING: SHX2209

## 2023-05-16 LAB — SURGICAL PCR SCREEN
MRSA, PCR: NEGATIVE
Staphylococcus aureus: POSITIVE — AB

## 2023-05-16 LAB — BASIC METABOLIC PANEL
Anion gap: 7 (ref 5–15)
BUN: 24 mg/dL — ABNORMAL HIGH (ref 8–23)
CO2: 23 mmol/L (ref 22–32)
Calcium: 9.3 mg/dL (ref 8.9–10.3)
Chloride: 106 mmol/L (ref 98–111)
Creatinine, Ser: 0.7 mg/dL (ref 0.44–1.00)
GFR, Estimated: >60 mL/min (ref >60)
Glucose, Bld: 147 mg/dL — ABNORMAL HIGH (ref 70–99)
Potassium: 4 mmol/L (ref 3.5–5.1)
Sodium: 136 mmol/L (ref 135–145)

## 2023-05-16 LAB — HIV ANTIBODY (ROUTINE TESTING W REFLEX): HIV Screen 4th Generation wRfx: NONREACTIVE

## 2023-05-16 LAB — CBC
HCT: 37.8 % (ref 36.0–46.0)
Hemoglobin: 11.6 g/dL — ABNORMAL LOW (ref 12.0–15.0)
MCH: 27.9 pg (ref 26.0–34.0)
MCHC: 30.7 g/dL (ref 30.0–36.0)
MCV: 90.9 fL (ref 80.0–100.0)
Platelets: 266 10*3/uL (ref 150–400)
RBC: 4.16 MIL/uL (ref 3.87–5.11)
RDW: 14.8 % (ref 11.5–15.5)
WBC: 9.4 10*3/uL (ref 4.0–10.5)
nRBC: 0 % (ref 0.0–0.2)

## 2023-05-16 SURGERY — PINNING, EXTREMITY, PERCUTANEOUS
Anesthesia: Monitor Anesthesia Care | Site: Hip | Laterality: Left

## 2023-05-16 MED ORDER — KETOROLAC TROMETHAMINE 30 MG/ML IJ SOLN
15.0000 mg | Freq: Four times a day (QID) | INTRAMUSCULAR | Status: AC
  Administered 2023-05-16 – 2023-05-17 (×3): 15 mg via INTRAVENOUS
  Filled 2023-05-16 (×2): qty 1

## 2023-05-16 MED ORDER — LIDOCAINE HCL (CARDIAC) PF 100 MG/5ML IV SOSY
PREFILLED_SYRINGE | INTRAVENOUS | Status: DC | PRN
  Administered 2023-05-16: 40 mg via INTRAVENOUS

## 2023-05-16 MED ORDER — MUPIROCIN 2 % EX OINT
1.0000 | TOPICAL_OINTMENT | Freq: Two times a day (BID) | CUTANEOUS | Status: DC
  Administered 2023-05-16 – 2023-05-17 (×4): 1 via NASAL
  Filled 2023-05-16 (×2): qty 22

## 2023-05-16 MED ORDER — OXYCODONE HCL 5 MG PO TABS: 5.0000 mg | ORAL_TABLET | Freq: Once | ORAL | Status: DC | PRN

## 2023-05-16 MED ORDER — FENTANYL CITRATE PF 50 MCG/ML IJ SOSY: 25.0000 ug | PREFILLED_SYRINGE | INTRAMUSCULAR | Status: DC | PRN

## 2023-05-16 MED ORDER — 0.9 % SODIUM CHLORIDE (POUR BTL) OPTIME
TOPICAL | Status: DC | PRN
Start: 2023-05-16 — End: 2023-05-16
  Administered 2023-05-16: 1000 mL

## 2023-05-16 MED ORDER — BUPIVACAINE IN DEXTROSE 0.75-8.25 % IT SOLN
INTRATHECAL | Status: DC | PRN
Start: 2023-05-16 — End: 2023-05-16
  Administered 2023-05-16: 1.6 mL via INTRATHECAL

## 2023-05-16 MED ORDER — CEFAZOLIN SODIUM-DEXTROSE 2-4 GM/100ML-% IV SOLN
2.0000 g | Freq: Three times a day (TID) | INTRAVENOUS | Status: AC
  Administered 2023-05-16 – 2023-05-17 (×2): 2 g via INTRAVENOUS
  Filled 2023-05-16 (×2): qty 100

## 2023-05-16 MED ORDER — ACETAMINOPHEN 160 MG/5ML PO SOLN: 1000.0000 mg | Freq: Once | ORAL | Status: DC | PRN

## 2023-05-16 MED ORDER — LACTATED RINGERS IV SOLN: INTRAVENOUS | Status: DC

## 2023-05-16 MED ORDER — ACETAMINOPHEN 500 MG PO TABS: 1000.0000 mg | ORAL_TABLET | Freq: Once | ORAL | Status: DC | PRN

## 2023-05-16 MED ORDER — PROPOFOL 10 MG/ML IV BOLUS
INTRAVENOUS | Status: AC
  Filled 2023-05-16: qty 20

## 2023-05-16 MED ORDER — MIDAZOLAM HCL 2 MG/2ML IJ SOLN
INTRAMUSCULAR | Status: AC
  Filled 2023-05-16: qty 2

## 2023-05-16 MED ORDER — LACTATED RINGERS IV SOLN: INTRAVENOUS | Status: DC | PRN

## 2023-05-16 MED ORDER — PROPOFOL 500 MG/50ML IV EMUL
INTRAVENOUS | Status: DC | PRN
Start: 2023-05-16 — End: 2023-05-16
  Administered 2023-05-16: 75 ug/kg/min via INTRAVENOUS

## 2023-05-16 MED ORDER — LABETALOL HCL 5 MG/ML IV SOLN
10.0000 mg | INTRAVENOUS | Status: DC | PRN
  Administered 2023-05-16 – 2023-05-18 (×2): 10 mg via INTRAVENOUS
  Filled 2023-05-16 (×2): qty 4

## 2023-05-16 MED ORDER — CHLORHEXIDINE GLUCONATE 0.12 % MT SOLN
15.0000 mL | Freq: Once | OROMUCOSAL | Status: AC
  Administered 2023-05-16: 15 mL via OROMUCOSAL

## 2023-05-16 MED ORDER — OXYCODONE HCL 5 MG/5ML PO SOLN: 5.0000 mg | Freq: Once | ORAL | Status: DC | PRN

## 2023-05-16 MED ORDER — LOSARTAN POTASSIUM 25 MG PO TABS
25.0000 mg | ORAL_TABLET | Freq: Every day | ORAL | Status: DC
  Administered 2023-05-16 – 2023-05-18 (×3): 25 mg via ORAL
  Filled 2023-05-16 (×3): qty 1

## 2023-05-16 MED ORDER — ACETAMINOPHEN 10 MG/ML IV SOLN: 1000.0000 mg | Freq: Once | INTRAVENOUS | Status: DC | PRN

## 2023-05-16 MED ORDER — BUPIVACAINE-EPINEPHRINE 0.25% -1:200000 IJ SOLN
INTRAMUSCULAR | Status: AC
  Filled 2023-05-16: qty 1

## 2023-05-16 MED ORDER — MIDAZOLAM HCL 5 MG/5ML IJ SOLN
INTRAMUSCULAR | Status: DC | PRN
  Administered 2023-05-16: 2 mg via INTRAVENOUS

## 2023-05-16 MED ORDER — PROPOFOL 10 MG/ML IV BOLUS
INTRAVENOUS | Status: DC | PRN
  Administered 2023-05-16: 50 mg via INTRAVENOUS
  Administered 2023-05-16: 30 mg via INTRAVENOUS

## 2023-05-16 MED ORDER — PHENYLEPHRINE HCL-NACL 20-0.9 MG/250ML-% IV SOLN
INTRAVENOUS | Status: DC | PRN
Start: 2023-05-16 — End: 2023-05-16
  Administered 2023-05-16: 30 ug/min via INTRAVENOUS

## 2023-05-16 MED ORDER — ASPIRIN 81 MG PO TBEC: 81.0000 mg | DELAYED_RELEASE_TABLET | Freq: Two times a day (BID) | ORAL | Status: AC

## 2023-05-16 MED ORDER — BUPIVACAINE-EPINEPHRINE 0.25% -1:200000 IJ SOLN
INTRAMUSCULAR | Status: DC | PRN
Start: 2023-05-16 — End: 2023-05-16
  Administered 2023-05-16: 30 mL

## 2023-05-16 MED ORDER — OXYCODONE HCL 5 MG PO TABS
5.0000 mg | ORAL_TABLET | ORAL | 0 refills | Status: AC | PRN
Start: 2023-05-16 — End: 2023-05-23

## 2023-05-16 MED ORDER — ONDANSETRON HCL 4 MG/2ML IJ SOLN
INTRAMUSCULAR | Status: DC | PRN
  Administered 2023-05-16: 4 mg via INTRAVENOUS

## 2023-05-16 SURGICAL SUPPLY — 69 items
ADH SKN CLS APL DERMABOND .7 (GAUZE/BANDAGES/DRESSINGS) ×1
APL PRP STRL LF DISP 70% ISPRP (MISCELLANEOUS) ×1
BAG COUNTER SPONGE SURGICOUNT (BAG) ×1 IMPLANT
BAG SPNG CNTER NS LX DISP (BAG) ×1
BIT DRILL 4.8X200 CANN (BIT) IMPLANT
BLADE SURG 15 STRL LF DISP TIS (BLADE) ×1 IMPLANT
BLADE SURG 15 STRL SS (BLADE) ×1
BNDG CMPR 5X2 KNTD ELC UNQ LF (GAUZE/BANDAGES/DRESSINGS)
BNDG CMPR 5X3 KNIT ELC UNQ LF (GAUZE/BANDAGES/DRESSINGS)
BNDG CMPR 5X4 CHSV STRCH STRL (GAUZE/BANDAGES/DRESSINGS)
BNDG CMPR 5X4 KNIT ELC UNQ LF (GAUZE/BANDAGES/DRESSINGS) ×1
BNDG CMPR 9X4 STRL LF SNTH (GAUZE/BANDAGES/DRESSINGS)
BNDG COHESIVE 4X5 TAN STRL LF (GAUZE/BANDAGES/DRESSINGS) IMPLANT
BNDG ELASTIC 2INX 5YD STR LF (GAUZE/BANDAGES/DRESSINGS) IMPLANT
BNDG ELASTIC 3INX 5YD STR LF (GAUZE/BANDAGES/DRESSINGS) IMPLANT
BNDG ELASTIC 4INX 5YD STR LF (GAUZE/BANDAGES/DRESSINGS) ×1 IMPLANT
BNDG ESMARK 4X9 LF (GAUZE/BANDAGES/DRESSINGS) IMPLANT
BRUSH SCRUB EZ PLAIN DRY (MISCELLANEOUS) ×1 IMPLANT
CHLORAPREP W/TINT 26 (MISCELLANEOUS) ×1 IMPLANT
COVER BACK TABLE 60X90IN (DRAPES) ×1 IMPLANT
COVER SURGICAL LIGHT HANDLE (MISCELLANEOUS) ×1 IMPLANT
DERMABOND ADVANCED .7 DNX12 (GAUZE/BANDAGES/DRESSINGS) IMPLANT
DRAPE C-ARM 42X120 X-RAY (DRAPES) ×1 IMPLANT
DRAPE C-ARMOR (DRAPES) ×1 IMPLANT
DRAPE EXTREMITY T 121X128X90 (DISPOSABLE) IMPLANT
DRAPE OEC MINIVIEW 54X84 (DRAPES) ×1 IMPLANT
DRAPE U-SHAPE 47X51 STRL (DRAPES) ×2 IMPLANT
DRSG TEGADERM 4X4.75 (GAUZE/BANDAGES/DRESSINGS) IMPLANT
DURAPREP 26ML APPLICATOR (WOUND CARE) ×1 IMPLANT
ELECT REM PT RETURN 15FT ADLT (MISCELLANEOUS) ×1 IMPLANT
GAUZE SPONGE 4X4 12PLY STRL (GAUZE/BANDAGES/DRESSINGS) ×1 IMPLANT
GAUZE XEROFORM 1X8 LF (GAUZE/BANDAGES/DRESSINGS) IMPLANT
GLOVE BIO SURGEON STRL SZ 6.5 (GLOVE) ×3 IMPLANT
GLOVE BIO SURGEON STRL SZ7.5 (GLOVE) ×1 IMPLANT
GLOVE BIOGEL PI IND STRL 6.5 (GLOVE) ×1 IMPLANT
GLOVE BIOGEL PI IND STRL 7.5 (GLOVE) ×1 IMPLANT
GLOVE BIOGEL PI IND STRL 8 (GLOVE) ×3 IMPLANT
GLOVE SURG ORTHO 8.0 STRL STRW (GLOVE) ×2 IMPLANT
GOWN STRL REUS W/ TWL LRG LVL3 (GOWN DISPOSABLE) ×2 IMPLANT
GOWN STRL REUS W/ TWL XL LVL3 (GOWN DISPOSABLE) ×2 IMPLANT
GOWN STRL REUS W/TWL LRG LVL3 (GOWN DISPOSABLE) ×2
GOWN STRL REUS W/TWL XL LVL3 (GOWN DISPOSABLE) ×2
KIT BASIN OR (CUSTOM PROCEDURE TRAY) ×2 IMPLANT
KIT TURNOVER KIT A (KITS) IMPLANT
MANIFOLD NEPTUNE II (INSTRUMENTS) ×1 IMPLANT
NDL HYPO 25X1 1.5 SAFETY (NEEDLE) IMPLANT
NEEDLE HYPO 25X1 1.5 SAFETY (NEEDLE) IMPLANT
NS IRRIG 1000ML POUR BTL (IV SOLUTION) ×1 IMPLANT
PACK GENERAL/GYN (CUSTOM PROCEDURE TRAY) ×1 IMPLANT
PAD ARMBOARD 7.5X6 YLW CONV (MISCELLANEOUS) ×2 IMPLANT
PAD CAST 3X4 CTTN HI CHSV (CAST SUPPLIES) IMPLANT
PAD CAST 4YDX4 CTTN HI CHSV (CAST SUPPLIES) ×1 IMPLANT
PADDING CAST ABS COTTON 4X4 ST (CAST SUPPLIES) ×1 IMPLANT
PADDING CAST COTTON 3X4 STRL (CAST SUPPLIES)
PADDING CAST COTTON 4X4 STRL (CAST SUPPLIES) ×1
PADDING UNDERCAST 2X4 STRL (CAST SUPPLIES) IMPLANT
PIN GUIDE DRILL TIP 2.8X300 (DRILL) IMPLANT
SCREW 16MM THREAD 6.5X85MM (Screw) IMPLANT
SCREW CANN THRD 6.5X80 (Screw) IMPLANT
SPLINT PLASTER CAST XFAST 4X15 (CAST SUPPLIES) IMPLANT
SPLINT PLASTER CAST XFAST 5X30 (CAST SUPPLIES) IMPLANT
STOCKINETTE 4X48 STRL (DRAPES) ×1 IMPLANT
SUT MNCRL AB 3-0 PS2 18 (SUTURE) ×1 IMPLANT
SUT VIC AB 2-0 CT1 27 (SUTURE) ×1
SUT VIC AB 2-0 CT1 TAPERPNT 27 (SUTURE) ×1 IMPLANT
SYR CONTROL 10ML LL (SYRINGE) IMPLANT
TOWEL GREEN STERILE FF (TOWEL DISPOSABLE) ×4 IMPLANT
UNDERPAD 30X36 HEAVY ABSORB (UNDERPADS AND DIAPERS) ×1 IMPLANT
WATER STERILE IRR 1000ML POUR (IV SOLUTION) ×1 IMPLANT

## 2023-05-16 NOTE — Progress Notes (Signed)
Initial Nutrition Assessment  DOCUMENTATION CODES:   Obesity unspecified  INTERVENTION:  - NPO for Surgery today.  - Recommend Ensure Plus High Protein po once daily once diet advanced, provides 350 kcal and 20 grams of protein. - Recommend Multivitamin with minerals daily - Monitor weight trends.   NUTRITION DIAGNOSIS:   Increased nutrient needs related to acute illness as evidenced by estimated needs.  GOAL:   Patient will meet greater than or equal to 90% of their needs  MONITOR:   PO intake, Supplement acceptance, Weight trends  REASON FOR ASSESSMENT:   Consult Hip fracture protocol  ASSESSMENT:   62 y.o. female with PMH significant for anxiety, osteoarthritis, essential HTN, fibromyalgia, dyslipidemia, IBS, chronic right-sided upper extremity tremors and migraine who presented with left femoral neck fracture .  Patient reports a UBW of 220-227# and denied any recent changes in weight.   Eating well PTA. Usually eats 2 meals a day, breakfast and lunch OR breakfast and dinner. Will sometimes have an Equate protein shake for breakfast. Appetite normal recently.   Patient currently NPO for surgery, notes she is hungry. Discussed increased calorie and protein needs with fracture and healing. Patient agreeable to receive Ensure during admission.     Medications reviewed and include: vitamin B12, MVI  Labs reviewed:  -   NUTRITION - FOCUSED PHYSICAL EXAM:  Flowsheet Row Most Recent Value  Orbital Region No depletion  Upper Arm Region No depletion  Thoracic and Lumbar Region No depletion  Buccal Region No depletion  Temple Region No depletion  Clavicle Bone Region No depletion  Clavicle and Acromion Bone Region No depletion  Scapular Bone Region Unable to assess  Dorsal Hand No depletion  Patellar Region No depletion  Anterior Thigh Region No depletion  Posterior Calf Region No depletion  Edema (RD Assessment) None  Hair Reviewed  Eyes Reviewed   Mouth Reviewed  Skin Reviewed  Nails Reviewed       Diet Order:   Diet Order             Diet NPO time specified Except for: Sips with Meds  Diet effective now                   EDUCATION NEEDS:  Education needs have been addressed  Skin:  Skin Assessment: Reviewed RN Assessment  Last BM:  PTA  Height:  Ht Readings from Last 1 Encounters:  05/15/23 5' 5.5" (1.664 m)   Weight:  Wt Readings from Last 1 Encounters:  05/15/23 108 kg    BMI:  Body mass index is 39.02 kg/m.  Estimated Nutritional Needs:  Kcal:  1800-2000 kcals Protein:  90-110 grams Fluid:  >/= 1.8L    Shelle Iron RD, LDN For contact information, refer to [facility].

## 2023-05-16 NOTE — Transfer of Care (Signed)
Immediate Anesthesia Transfer of Care Note  Patient: Dana Nichols  Procedure(s) Performed: CANNULATED HIP PINNING (Left: Hip)  Patient Location: PACU  Anesthesia Type:Spinal  Level of Consciousness: sedated  Airway & Oxygen Therapy: Patient Spontanous Breathing and Patient connected to face mask oxygen  Post-op Assessment: Report given to RN and Post -op Vital signs reviewed and stable  Post vital signs: Reviewed and stable  Last Vitals:  Vitals Value Taken Time  BP    Temp    Pulse 58 05/16/23 1730  Resp    SpO2 93 % 05/16/23 1730  Vitals shown include unfiled device data.  Last Pain:  Vitals:   05/16/23 1444  TempSrc: Oral  PainSc: 0-No pain      Patients Stated Pain Goal: 5 (05/16/23 9528)  Complications: No notable events documented.

## 2023-05-16 NOTE — Interval H&P Note (Signed)
The patient has been re-examined, and the chart reviewed, and there have been no interval changes to the documented history and physical.    Plan for L cannulated screw fixation for nondisplaced femoral neck fracture  The operative side was examined and the patient was confirmed to have sensation to DPN, SPN, TN intact, Motor EHL, ext, flex 5/5, and DP 2+, PT 2+, No significant edema.    The risks, benefits, and alternatives have been discussed at length with patient, and the patient is willing to proceed.  Left hip marked. Consent has been signed.

## 2023-05-16 NOTE — Op Note (Signed)
05/16/2023  5:24 PM  PATIENT:  Dana Nichols    PRE-OPERATIVE DIAGNOSIS:  LEFT nondisplaced femoral neck fracture  POST-OPERATIVE DIAGNOSIS:  Same  PROCEDURE:  CANNULATED HIP PINNING  SURGEON:  Inaki Vantine A Amaryllis Malmquist, MD  PHYSICIAN ASSISTANT: none  ANESTHESIA:   General  ESTIMATED BLOOD LOSS: 25cc.  PREOPERATIVE INDICATIONS:  Dana Nichols is a  62 y.o. female who fell and was found to have a diagnosis of LEFT HIP FRACTURE who elected for surgical management.    The risks benefits and alternatives were discussed with the patient preoperatively including but not limited to the risks of infection, bleeding, nerve injury, cardiopulmonary complications, blood clots, malunion, nonunion, avascular necrosis, the need for revision surgery, the potential for conversion to hemiarthroplasty, among others, and the patient was willing to proceed.  OPERATIVE IMPLANTS: 6.5 mm cannulated screws x3   OPERATIVE PROCEDURE: The patient was brought to the operating room and placed in supine position. IV antibiotics were given. General anesthesia administered. The patient was placed on the [facility] table. The operative extremity was positioned, without any significant reduction maneuver and was prepped and draped in usual sterile fashion.  Time out was performed.  Small incision was made distal to the greater trochanter, and 3 guidewires were introduced Into an inverted triangle configuration. The lengths were measured. The reduction was anatomic. I opened the cortex with a cannulated drill, and then placed the screws into position. Satisfactory fixation was achieved.  The wounds were irrigated copiously, and repaired with Vicryl, monocryl, and dermabond glue. Dressing with sterile gauze and tegaderm. There no complications and the patient tolerated the procedure well.  Post op recs: WB: WBAT Abx: ancef x23 hours post op Imaging: PACU xrays Dressing: keep intact until follow up, change PRN if soiled or  saturated. DVT prophylaxis: aspirin 81mg  BID POD1 x4 weeks Follow up: 2 weeks after surgery for a wound check with Dr. Blanchie Dessert at [facility]  Address: [address], [place], [place] [address]  Office Phone: [phone]   Weber Cooks, MD

## 2023-05-16 NOTE — Anesthesia Procedure Notes (Signed)
Spinal  Patient location during procedure: OR Start time: 05/16/2023 4:34 PM End time: 05/16/2023 4:41 PM Reason for block: surgical anesthesia Staffing Performed: anesthesiologist  Anesthesiologist: Val Eagle, MD Performed by: Val Eagle, MD Authorized by: Val Eagle, MD   Preanesthetic Checklist Completed: patient identified, IV checked, risks and benefits discussed, surgical consent, monitors and equipment checked, pre-op evaluation and timeout performed Spinal Block Patient position: left lateral decubitus Prep: DuraPrep Patient monitoring: heart rate, cardiac monitor, continuous pulse ox and blood pressure Approach: midline Location: L4-5 Injection technique: single-shot Needle Needle type: Pencan  Needle gauge: 24 G Needle length: 9 cm Assessment Sensory level: T6 Events: CSF return

## 2023-05-16 NOTE — Progress Notes (Signed)
     Subjective:  Patient reports pain as mild to moderate with some lingering numbness of thigh and shin since injury.  Icing leg, slept okay.  Has not eaten or drank since midnight.  NPO ordered.  Plan for IM Nail of femoral neck fracture today in OR.  Objective:   VITALS:   Vitals:   05/15/23 1940 05/15/23 2100 05/16/23 0121 05/16/23 0551  BP: 126/87 136/86 (!) 149/92 (!) 159/97  Pulse: 91 79 83 82  Resp: 18 18 16 17   Temp: 98.2 F (36.8 C) 98.3 F (36.8 C) 97.9 F (36.6 C) 98 F (36.7 C)  TempSrc: Oral Oral Oral Oral  SpO2: 100% 99% 94% 98%  Weight: 108 kg     Height: 5' 5.5" (1.664 m)       AAOx4, sitting comfortably Mild subjective decreased sensation of anterior left thigh and shin, otherwise neurovascular intact BLE Dorsiflexion/Plantar flexion intact Compartment soft Skin intact Wiggles toes appropriately   Lab Results  Component Value Date   WBC 9.4 05/16/2023   HGB 11.6 (L) 05/16/2023   HCT 37.8 05/16/2023   MCV 90.9 05/16/2023   PLT 266 05/16/2023   BMET    Component Value Date/Time   NA 136 05/16/2023 0338   K 4.0 05/16/2023 0338   CL 106 05/16/2023 0338   CO2 23 05/16/2023 0338   GLUCOSE 147 (H) 05/16/2023 0338   BUN 24 (H) 05/16/2023 0338   CREATININE 0.70 05/16/2023 0338   CALCIUM 9.3 05/16/2023 0338   GFRNONAA >60 05/16/2023 0338      IMAGING: Xray and MRI left hip show nondisplaced left femoral neck fracture   Assessment/Plan: Day of Surgery   Principal Problem:   Femur fracture (HCC) Active Problems:   Closed left hip fracture (HCC)   Hypertensive urgency   Dyslipidemia   Fibromyalgia  Patient NPO since midnight.  Plan to proceed with closed reduction and percutaneous pinning of the left femoral neck fracture today.  Patient given opportunity to ask questions which were answered to the best of my ability and patient in agreement with plan.   Cecil Cobbs 05/16/2023, 6:47 AM   Weber Cooks, MD  Contact  information:   [phone] 7am-5pm epic message Dr. Blanchie Dessert, or call office for patient follow up: [phone] After hours and holidays please check [email or web address] for group call information for [facility]

## 2023-05-16 NOTE — Discharge Instructions (Signed)
Orthopedic Discharge Instructions  Diet: As you were doing prior to hospitalization   Shower:  May shower but keep the wounds dry, use an occlusive plastic wrap, NO SOAKING IN TUB.  If the bandage gets wet, change with a clean dry gauze.    Dressing:  You may change your dressing 3-5 days after surgery, unless you have a splint.  If the dressing remains clean and dry it can also be left on until follow up. If you change the dressing replace with clean gauze and tape or ace wrap.    Activity:  Increase activity slowly as tolerated, but follow the weight bearing instructions below.  The rules on driving is that you can not be taking narcotics while you drive, and you must feel in control of the vehicle.    Weight Bearing:   weight bearing as tolerated left leg.    Blood clot prevention (DVT Prophylaxis): After surgery you are at an increased risk for a blood clot. you were prescribed a blood thinner, aspirin 81mg , to be taken twice daily for a total of 4 weeks from surgery to help reduce your risk of getting a blood clot. This will help prevent a blood clot. Signs of a pulmonary embolus (blood clot in the lungs) include sudden short of breath, feeling lightheaded or dizzy, chest pain with a deep breath, rapid pulse rapid breathing. Signs of a blood clot in your arms or legs include new unexplained swelling and cramping, warm, red or darkened skin around the painful area. Please call the office or 911 right away if these signs or symptoms develop. To prevent constipation: you may use a stool softener such as -  Colace (over the counter) 100 mg by mouth twice a day  Drink plenty of fluids (prune juice may be helpful) and high fiber foods Miralax (over the counter) for constipation as needed.    Itching:  If you experience itching with your medications, try taking only a single pain pill, or even half a pain pill at a time.  You may take up to 10 pain pills per day, and you can also use benadryl over  the counter for itching or also to help with sleep.   Precautions:  If you experience chest pain or shortness of breath - call 911 immediately for transfer to the hospital emergency department!!   Call office [phone]) for the following: Temperature greater than 101F Persistent nausea and vomiting Severe uncontrolled pain Redness, tenderness, or signs of infection (pain, swelling, redness, odor or green/yellow discharge around the site) Difficulty breathing, headache or visual disturbances Hives Persistent dizziness or light-headedness Extreme fatigue Any other questions or concerns you may have after discharge  In an emergency, call 911 or go to an Emergency Department at a nearby hospital  Follow- Up Appointment:  Please call for an appointment to be seen approximately 2-3 week after surgery in [facility] with your surgeon Dr. Weber Cooks - [phone] Address: [address], [place], [place] [address]

## 2023-05-16 NOTE — Progress Notes (Signed)
[facility] Progress Note  Patient: Dana Nichols     UJW:[id number]  DOA: 05/15/2023   PCP: Irven Coe, MD       Brief hospital course: This is a 62 year old female with hypertension, fibromyalgia, migraines, chronic tremors, osteoarthritis, IBS and dyslipidemia who presents to the hospital for left hip pain and inability to bear weight on the left hip.  MRI reveals a nondisplaced femoral neck fracture.  Subjective:  Left hip pain is mostly controlled due to nerve block.  She has right shoulder pain which is chronic.  Aside from this she has no complaints Assessment and Plan: Principal Problem: Left hip fracture -Plan for IM nail today  Active Problems:    Hypertensive urgency -She has chronic hypertension but BP in the hospital is slightly higher than usual likely secondary acute stress and elevated cortisol -No complaints of headache or chest pain -Continue losartan and verapamil -DC normal saline  Chronic pain -Continue gabapentin and Cymbalta -Hold Celebrex and start Toradol 4 times daily      Code Status: Full Code Total time on patient care: 35 min DVT prophylaxis:  SCDs Start: 05/15/23 1750     Objective:   Vitals:   05/16/23 0936 05/16/23 1032 05/16/23 1334 05/16/23 1444  BP: (!) 173/107 (!) 172/102 (!) 177/107 (!) 165/122  Pulse: 86 72 88 84  Resp: 16  16 15   Temp: 97.8 F (36.6 C)  99.1 F (37.3 C) 98.3 F (36.8 C)  TempSrc: Oral  Oral Oral  SpO2: 95%  100% 96%  Weight:    108 kg  Height:    5' 5.5" (1.664 m)   Filed Weights   05/15/23 1940 05/16/23 1444  Weight: 108 kg 108 kg   Exam: General exam: Appears comfortable  HEENT: oral mucosa moist Respiratory system: Clear to auscultation.  Cardiovascular system: S1 & S2 heard  Gastrointestinal system: Abdomen soft, non-tender, nondistended. Normal bowel sounds   Extremities: No cyanosis, clubbing or edema Psychiatry:  Mood & affect appropriate.      CBC: Recent Labs  Lab  05/15/23 1857 05/16/23 0338  WBC 9.3 9.4  HGB 11.8* 11.6*  HCT 38.0 37.8  MCV 91.6 90.9  PLT 289 266   Basic Metabolic Panel: Recent Labs  Lab 05/15/23 1857 05/16/23 0338  NA 140 136  K 4.1 4.0  CL 105 106  CO2 26 23  GLUCOSE 92 147*  BUN 21 24*  CREATININE 0.73 0.70  CALCIUM 9.4 9.3     Scheduled Meds:  chlorhexidine  60 mL Topical Once   [MAR Hold] cyanocobalamin  1,000 mcg Oral Daily   [MAR Hold] DULoxetine  60 mg Oral BID   [MAR Hold] Fezolinetant  45 mg Oral Daily   [MAR Hold] gabapentin  600 mg Oral TID   [MAR Hold] ketorolac  15 mg Intravenous Q6H   [MAR Hold] losartan  25 mg Oral Daily   [MAR Hold] multivitamin with minerals  1 tablet Oral Daily   [MAR Hold] mupirocin ointment  1 Application Nasal BID   [MAR Hold] rosuvastatin  5 mg Oral QODAY   [MAR Hold] Semaglutide-Weight Management  0.25 mg Subcutaneous Weekly   [MAR Hold] valACYclovir  500 mg Oral BID   [MAR Hold] verapamil  240 mg Oral Daily   Continuous Infusions:   ceFAZolin (ANCEF) IV     lactated ringers 10 mL/hr at 05/16/23 1506   Imaging and lab data was personally reviewed   Author: Ladell Heads Maverick Dieudonne  05/16/2023 4:38 PM  To contact [facility]>   Check the care team in [facility] and look for the attending/consulting [facility] provider listed  Log into [email or web address] and use [place]'s universal password   Go to> "[facility]"  and find provider  If you still have difficulty reaching the provider, please page the [facility] (Director on Call) for the Hospitalists listed on amion

## 2023-05-16 NOTE — Anesthesia Preprocedure Evaluation (Signed)
Anesthesia Evaluation  Patient identified by MRN, date of birth, ID band Patient awake    Reviewed: Allergy & Precautions, NPO status , Patient's Chart, lab work & pertinent test results  History of Anesthesia Complications Negative for: history of anesthetic complications  Airway Mallampati: II  TM Distance: >3 FB Neck ROM: Full    Dental  (+) Teeth Intact, Dental Advisory Given   Pulmonary neg pulmonary ROS   breath sounds clear to auscultation       Cardiovascular hypertension, Pt. on medications (-) angina (-) Past MI and (-) CHF  Rhythm:Regular Rate:Normal     Neuro/Psych  Headaches PSYCHIATRIC DISORDERS Anxiety        GI/Hepatic negative GI ROS, Neg liver ROS,,,  Endo/Other  negative endocrine ROS    Renal/GU negative Renal ROS     Musculoskeletal  (+) Arthritis ,  Fibromyalgia -Left hip fx   Abdominal   Peds  Hematology  (+) Blood dyscrasia, anemia Lab Results      Component                Value               Date                      WBC                      9.4                 05/16/2023                HGB                      11.6 (L)            05/16/2023                HCT                      37.8                05/16/2023                MCV                      90.9                05/16/2023                PLT                      266                 05/16/2023            Denies blood thinners    Anesthesia Other Findings   Reproductive/Obstetrics                              Anesthesia Physical Anesthesia Plan  ASA: 2  Anesthesia Plan: Spinal and MAC   Post-op Pain Management:    Induction: Intravenous  PONV Risk Score and Plan: 2 and Ondansetron, Propofol infusion and Midazolam  Airway Management Planned: Nasal Cannula, Natural Airway and Simple Face Mask  Additional Equipment: None  Intra-op Plan:   Post-operative Plan:   Informed Consent: I have  reviewed the patients  History and Physical, chart, labs and discussed the procedure including the risks, benefits and alternatives for the proposed anesthesia with the patient or authorized representative who has indicated his/her understanding and acceptance.     Dental advisory given  Plan Discussed with: CRNA  Anesthesia Plan Comments:         Anesthesia Quick Evaluation

## 2023-05-17 DIAGNOSIS — S72001A Fracture of unspecified part of neck of right femur, initial encounter for closed fracture: Secondary | ICD-10-CM | POA: Diagnosis not present

## 2023-05-17 NOTE — Progress Notes (Signed)
[facility] Progress Note  Patient: Dana Nichols     ZDG:[id number]  DOA: 05/15/2023   PCP: Irven Coe, MD       Brief hospital course: This is a 62 year old female with hypertension, fibromyalgia, migraines, chronic tremors, osteoarthritis, IBS and dyslipidemia who presents to the hospital for left hip pain and inability to bear weight on the left hip.  MRI reveals a nondisplaced femoral neck fracture.  05/17/2023: Patient underwent pinning of the left hip yesterday, 05/16/2023.  Blood pressure is better controlled.  No new complaints today.  Pursue disposition.  TOC input is highly appreciated.  Subjective:  Left hip pain is mostly controlled due to nerve block.  She has right shoulder pain which is chronic.  Aside from this she has no complaints  Assessment and Plan: Principal Problem: Left hip fracture: -S/p cannulated hip pinning (postop day 1)  -Pursue disposition.  Active Problems: Hypertensive urgency: -She has chronic hypertension but BP in the hospital is slightly higher than usual likely secondary acute stress and elevated cortisol -No complaints of headache or chest pain -Continue losartan and verapamil -DC normal saline 05/17/2023: Blood pressure control is improving, but not optimized.  Chronic pain: -Continue gabapentin and Cymbalta -Hold Celebrex and start Toradol 4 times daily      Code Status: Full Code Total time on patient care: 35 min DVT prophylaxis:  SCDs Start: 05/15/23 1750     Objective:   Vitals:   05/16/23 2055 05/16/23 2317 05/16/23 2353 05/17/23 0616  BP: (!) 179/94 (!) 164/102 (!) 143/82 137/87  Pulse: 89  95 (!) 103  Resp: 17  17 17   Temp: 98 F (36.7 C)  98.3 F (36.8 C) 98.8 F (37.1 C)  TempSrc: Oral  Oral Oral  SpO2: 95%  94% 94%  Weight:      Height:       Filed Weights   05/15/23 1940 05/16/23 1444  Weight: 108 kg 108 kg   Exam: General exam: Appears comfortable.  Patient is obese. HEENT: Mild pallor.  No  jaundice.   Respiratory system: Clear to auscultation.  Cardiovascular system: S1 & S2 heard  Gastrointestinal system: Abdomen obese, soft, non-tender   Extremities: No leg edema. Neuro: Moves all extremities.  Awake and alert.  CBC: Recent Labs  Lab 05/15/23 1857 05/16/23 0338  WBC 9.3 9.4  HGB 11.8* 11.6*  HCT 38.0 37.8  MCV 91.6 90.9  PLT 289 266   Basic Metabolic Panel: Recent Labs  Lab 05/15/23 1857 05/16/23 0338  NA 140 136  K 4.1 4.0  CL 105 106  CO2 26 23  GLUCOSE 92 147*  BUN 21 24*  CREATININE 0.73 0.70  CALCIUM 9.4 9.3     Scheduled Meds:  cyanocobalamin  1,000 mcg Oral Daily   DULoxetine  60 mg Oral BID   Fezolinetant  45 mg Oral Daily   gabapentin  600 mg Oral TID   losartan  25 mg Oral Daily   multivitamin with minerals  1 tablet Oral Daily   mupirocin ointment  1 Application Nasal BID   rosuvastatin  5 mg Oral QODAY   Semaglutide-Weight Management  0.25 mg Subcutaneous Weekly   valACYclovir  500 mg Oral BID   verapamil  240 mg Oral Daily   Continuous Infusions:   Imaging and lab data was personally reviewed   Author: Barnetta Chapel  05/17/2023 12:10 PM  To contact [facility]>   Check the care team in [facility] and look for the  attending/consulting [facility] provider listed  Log into [email or web address] and use [place]'s universal password   Go to> "[facility]"  and find provider  If you still have difficulty reaching the provider, please page the [facility] (Director on Call) for the Hospitalists listed on amion

## 2023-05-17 NOTE — Evaluation (Signed)
Physical Therapy Evaluation Patient Details Name: KEYANDRA WESTERFELD MRN: [id number] DOB: 01-13-1961 Today's Date: 05/17/2023  History of Present Illness  62 yo female s/p cannulated L hip pinning on 05/16/23 d/t FNF. PMH: anxiety, osteoarthritis, essential hypertension, fibromyalgia, dyslipidemia, IBS, chronic right-sided upper extremity tremors and migraine  Clinical Impression  Pt admitted with above diagnosis.  Pt agreeable and motivated to work with PT.  Amb 200' with RW and CGA for safety. Continue PT in acute setting, should be ready to d/c tomorrow after PT session  Pt currently with functional limitations due to the deficits listed below (see PT Problem List). Pt will benefit from acute skilled PT to increase their independence and safety with mobility to allow discharge.           If plan is discharge home, recommend the following: A little help with walking and/or transfers;Help with stairs or ramp for entrance;Assist for transportation   Can travel by private vehicle        Equipment Recommendations None recommended by PT (borrowed RW)  Recommendations for Other Services       Functional Status Assessment Patient has had a recent decline in their functional status and demonstrates the ability to make significant improvements in function in a reasonable and predictable amount of time.     Precautions / Restrictions Restrictions Weight Bearing Restrictions: No LLE Weight Bearing: Weight bearing as tolerated      Mobility  Bed Mobility Overal bed mobility: Needs Assistance Bed Mobility: Supine to Sit     Supine to sit: Supervision     General bed mobility comments: for safety    Transfers Overall transfer level: Needs assistance Equipment used: Rolling walker (2 wheels) Transfers: Sit to/from Stand Sit to Stand: Contact guard assist           General transfer comment: cues for safety and hand placement    Ambulation/Gait Ambulation/Gait assistance:  Contact guard assist Gait Distance (Feet): 200 Feet Assistive device: Rolling walker (2 wheels) Gait Pattern/deviations: Step-to pattern, Step-through pattern       General Gait Details: progression to step through without incr pain; no LOB, cues for proximity to [employer] Mobility     Tilt Bed    Modified Rankin (Stroke Patients Only)       Balance                                             Pertinent Vitals/Pain Pain Assessment Pain Assessment: 0-10 Pain Score: 1  Pain Location: L hip Pain Descriptors / Indicators: Discomfort Pain Intervention(s): Limited activity within patient's tolerance, Monitored during session, Repositioned, Ice applied    Home Living Family/patient expects to be discharged to:: Private residence Living Arrangements: Spouse/significant other Available Help at Discharge: Family;Available 24 hours/day Type of Home: House Home Access: Stairs to enter   [employer] of Steps: 5   Home Layout: One level Home Equipment: Agricultural consultant (2 wheels) (sister borrowed)      Prior Function Prior Level of Function : Independent/Modified Independent                     Extremity/Trunk Armed forces training and education officer Communication: No apparent difficulties  Cognition Arousal:  Alert Behavior During Therapy: WFL for tasks assessed/performed Overall Cognitive Status: Within Functional Limits for tasks assessed                                          General Comments      Exercises     Assessment/Plan    PT Assessment Patient needs continued PT services  PT Problem List Decreased mobility;Decreased knowledge of use of DME       PT Treatment Interventions DME instruction;Gait training;Stair training;Functional mobility training;Therapeutic activities;Therapeutic exercise;Patient/family education    PT Goals (Current goals can  be found in the Care Plan section)  Acute Rehab PT Goals PT Goal Formulation: With patient Time For Goal Achievement: 05/23/23 Potential to Achieve Goals: Good    Frequency Min 1X/week     Co-evaluation               AM-PAC PT "6 Clicks" Mobility  Outcome Measure Help needed turning from your back to your side while in a flat bed without using bedrails?: A Little Help needed moving from lying on your back to sitting on the side of a flat bed without using bedrails?: A Little Help needed moving to and from a bed to a chair (including a wheelchair)?: A Little Help needed standing up from a chair using your arms (e.g., wheelchair or bedside chair)?: A Little Help needed to walk in hospital room?: A Little Help needed climbing 3-5 steps with a railing? : A Little 6 Click Score: 18    End of Session Equipment Utilized During Treatment: Gait belt Activity Tolerance: Patient tolerated treatment well Patient left: in bed;with call bell/phone within reach;with bed alarm set Nurse Communication: Mobility status PT Visit Diagnosis: Other abnormalities of gait and mobility (R26.89)    Time: 1275-1700 PT Time Calculation (min) (ACUTE ONLY): 13 min   Charges:     PT Treatments $Gait Training: 8-22 mins PT General Charges $$ ACUTE PT VISIT: 1 Visit         Delice Bison, PT  Acute Rehab Dept [facility]) [phone]  05/17/2023   [facility] 05/17/2023, 3:39 PM

## 2023-05-17 NOTE — Progress Notes (Signed)
     Subjective: 1 Day Post-Op s/p Procedure(s): CANNULATED HIP PINNING   Patient is alert, oriented. Pain was severe last night, but much better controlled today. Eager to work with PT. Does have good home support between husband and sisters and would like to discharge home when ready. No other complaints.    Objective:  PE: VITALS:   Vitals:   05/16/23 2055 05/16/23 2317 05/16/23 2353 05/17/23 0616  BP: (!) 179/94 (!) 164/102 (!) 143/82 137/87  Pulse: 89  95 (!) 103  Resp: 17  17 17   Temp: 98 F (36.7 C)  98.3 F (36.8 C) 98.8 F (37.1 C)  TempSrc: Oral  Oral Oral  SpO2: 95%  94% 94%  Weight:      Height:        ABD soft Sensation intact distally Intact pulses distally Dorsiflexion/Plantar flexion intact Incision: dressing C/D/I  LABS  No results found for this or any previous visit (from the past 24 hour(s)).  DG HIP UNILAT W OR W/O PELVIS 2-3 VIEWS LEFT  Result Date: 05/16/2023 CLINICAL DATA:  Status post left hip pinning. EXAM: DG HIP (WITH OR WITHOUT PELVIS) 2-3V LEFT COMPARISON:  None Available. FINDINGS: Three screws traverse left femoral neck. No periprosthetic lucency. Bony pelvis is intact. IMPRESSION: Three screws traverse left femoral neck without immediate postoperative complication. Electronically Signed   By: Narda Rutherford M.D.   On: 05/16/2023 18:38   DG HIP UNILAT WITH PELVIS 2-3 VIEWS LEFT  Result Date: 05/16/2023 CLINICAL DATA:  Elective surgery. EXAM: DG HIP (WITH OR WITHOUT PELVIS) 2-3V LEFT COMPARISON:  None Available. FINDINGS: Eight fluoroscopic spot views of the pelvis and left hip obtained in the operating room. Sequential images during left hip pinning. Fluoroscopy time 1 minute. Dose 23.35 mGy. IMPRESSION: Procedural fluoroscopy for left hip pinning. Electronically Signed   By: Narda Rutherford M.D.   On: 05/16/2023 18:37   DG C-Arm 1-60 Min-No Report  Result Date: 05/16/2023 Fluoroscopy was utilized by the requesting physician.  No  radiographic interpretation.   Chest Portable 1 View  Result Date: 05/15/2023 CLINICAL DATA:  Preoperative cardiovascular exam. EXAM: PORTABLE CHEST 1 VIEW COMPARISON:  A 10/05/2021. FINDINGS: The heart size and mediastinal contours are within normal limits. Lung volumes are low. No consolidation, effusion, or pneumothorax. No acute osseous abnormality. IMPRESSION: No active disease. Electronically Signed   By: Thornell Sartorius M.D.   On: 05/15/2023 22:19    Assessment/Plan: Left femoral neck fracture  1 Day Post-Op s/p Procedure(s): CANNULATED HIP PINNING - Hbg stable at 11.6 this morning  Post op recs: WB: WBAT Abx: ancef x23 hours post op Imaging: PACU xrays Dressing: keep intact until follow up, change PRN if soiled or saturated. DVT prophylaxis: aspirin 81mg  BID POD1 x4 weeks Follow up: 2 weeks after surgery for a wound check with Dr. Blan[facility] Dessert at [facility].  Address: [address], [place], [place] [address]  Office Phone: [phone] Dispo: pending PT/OT evals today, would like to d/c home as she has ample support. Likely to need HHPT set up prior to discharge if discharging home.  Armida Sans 05/17/2023, 11:19 AM

## 2023-05-18 DIAGNOSIS — S72001A Fracture of unspecified part of neck of right femur, initial encounter for closed fracture: Secondary | ICD-10-CM | POA: Diagnosis not present

## 2023-05-18 LAB — CBC WITH DIFFERENTIAL/PLATELET
Abs Immature Granulocytes: 0.02 10*3/uL (ref 0.00–0.07)
Basophils Absolute: 0 10*3/uL (ref 0.0–0.1)
Basophils Relative: 0 %
Eosinophils Absolute: 0.2 10*3/uL (ref 0.0–0.5)
Eosinophils Relative: 2 %
HCT: 36.1 % (ref 36.0–46.0)
Hemoglobin: 11 g/dL — ABNORMAL LOW (ref 12.0–15.0)
Immature Granulocytes: 0 %
Lymphocytes Relative: 33 %
Lymphs Abs: 2.6 10*3/uL (ref 0.7–4.0)
MCH: 27.8 pg (ref 26.0–34.0)
MCHC: 30.5 g/dL (ref 30.0–36.0)
MCV: 91.4 fL (ref 80.0–100.0)
Monocytes Absolute: 0.7 10*3/uL (ref 0.1–1.0)
Monocytes Relative: 9 %
Neutro Abs: 4.5 10*3/uL (ref 1.7–7.7)
Neutrophils Relative %: 56 %
Platelets: 252 10*3/uL (ref 150–400)
RBC: 3.95 MIL/uL (ref 3.87–5.11)
RDW: 15.2 % (ref 11.5–15.5)
WBC: 8 10*3/uL (ref 4.0–10.5)
nRBC: 0 % (ref 0.0–0.2)

## 2023-05-18 LAB — RENAL FUNCTION PANEL
Albumin: 3 g/dL — ABNORMAL LOW (ref 3.5–5.0)
Anion gap: 8 (ref 5–15)
BUN: 20 mg/dL (ref 8–23)
CO2: 26 mmol/L (ref 22–32)
Calcium: 9.1 mg/dL (ref 8.9–10.3)
Chloride: 109 mmol/L (ref 98–111)
Creatinine, Ser: 0.87 mg/dL (ref 0.44–1.00)
GFR, Estimated: >60 mL/min (ref >60)
Glucose, Bld: 115 mg/dL — ABNORMAL HIGH (ref 70–99)
Phosphorus: 4.3 mg/dL (ref 2.5–4.6)
Potassium: 4.5 mmol/L (ref 3.5–5.1)
Sodium: 143 mmol/L (ref 135–145)

## 2023-05-18 LAB — MAGNESIUM: Magnesium: 2.3 mg/dL (ref 1.7–2.4)

## 2023-05-18 MED ORDER — MUPIROCIN 2 % EX OINT: 1.0000 | TOPICAL_OINTMENT | Freq: Two times a day (BID) | CUTANEOUS | 0 refills | Status: AC

## 2023-05-18 MED ORDER — METAXALONE 800 MG PO TABS: 800.0000 mg | ORAL_TABLET | Freq: Three times a day (TID) | ORAL | 0 refills | Status: AC | PRN

## 2023-05-18 NOTE — Anesthesia Postprocedure Evaluation (Signed)
Anesthesia Post Note  Patient: TIFFANIE WENTLING  Procedure(s) Performed: CANNULATED HIP PINNING (Left: Hip)     Patient location during evaluation: PACU Anesthesia Type: MAC and Spinal Level of consciousness: awake and alert Pain management: pain level controlled Vital Signs Assessment: post-procedure vital signs reviewed and stable Respiratory status: spontaneous breathing, nonlabored ventilation and respiratory function stable Cardiovascular status: stable and blood pressure returned to baseline Postop Assessment: no apparent nausea or vomiting Anesthetic complications: no   No notable events documented.  Last Vitals:  Vitals:   05/17/23 2215 05/18/23 0652  BP: (!) 146/91 (!) 153/90  Pulse: 90 83  Resp: 18 18  Temp: 36.8 C 36.7 C  SpO2: 99% 99%    Last Pain:  Vitals:   05/18/23 0622  TempSrc:   PainSc: 5                  Nakota Ackert

## 2023-05-18 NOTE — Progress Notes (Signed)
Physical Therapy Treatment Patient Details Name: Dana Nichols MRN: [id number] DOB: [birth year] Today's Date: 05/18/2023   History of Present Illness 62 yo female s/p cannulated L hip pinning on 05/16/23 d/t FNF. PMH: anxiety, osteoarthritis, essential hypertension, fibromyalgia, dyslipidemia, IBS, chronic right-sided upper extremity tremors and migraine    PT Comments  Pt is progressing well, meeting goals. Pt feels ready to d/c home with family assisting as needed    If plan is discharge home, recommend the following: A little help with walking and/or transfers;Help with stairs or ramp for entrance;Assist for transportation   Can travel by private vehicle        Equipment Recommendations  None recommended by PT (borrowed RW)    Recommendations for Other Services       Precautions / Restrictions Precautions Precautions: None Restrictions Weight Bearing Restrictions: No LLE Weight Bearing: Weight bearing as tolerated     Mobility  Bed Mobility Overal bed mobility: Modified Independent       Supine to sit: HOB elevated          Transfers Overall transfer level: Needs assistance Equipment used: Rolling walker (2 wheels) Transfers: Sit to/from Stand Sit to Stand: Supervision, Modified independent (Device/Increase time)           General transfer comment: cues for hand placement, no physical assist    Ambulation/Gait Ambulation/Gait assistance: Supervision, Modified independent (Device/Increase time) Gait Distance (Feet): 250 Feet Assistive device: Rolling walker (2 wheels) Gait Pattern/deviations: Step-through pattern       General Gait Details: step through pattern with no LOB, near equal step length with wt shift improved to LLE with incr distance   Stairs Stairs: Yes Stairs assistance: Supervision Stair Management: Two rails, Step to pattern, Forwards Number of Stairs: 5 General stair comments: cues for sequence; good stability, no  LOB   Wheelchair Mobility     Tilt Bed    Modified Rankin (Stroke Patients Only)       Balance                                            Cognition Arousal: Alert Behavior During Therapy: WFL for tasks assessed/performed Overall Cognitive Status: Within Functional Limits for tasks assessed                                          Exercises      General Comments        Pertinent Vitals/Pain Pain Assessment Pain Assessment: 0-10 Pain Score: 2  Pain Location: L hip Pain Descriptors / Indicators: Discomfort Pain Intervention(s): Limited activity within patient's tolerance, Monitored during session, Repositioned, Ice applied    Home Living                          Prior Function            PT Goals (current goals can now be found in the care plan section) Acute Rehab PT Goals PT Goal Formulation: With patient Time For Goal Achievement: 05/23/23 Potential to Achieve Goals: Good Progress towards PT goals: Progressing toward goals    Frequency    Min 1X/week      PT Plan      Co-evaluation  AM-PAC PT "6 Clicks" Mobility   Outcome Measure  Help needed turning from your back to your side while in a flat bed without using bedrails?: None Help needed moving from lying on your back to sitting on the side of a flat bed without using bedrails?: A Little Help needed moving to and from a bed to a chair (including a wheelchair)?: A Little Help needed standing up from a chair using your arms (e.g., wheelchair or bedside chair)?: A Little Help needed to walk in hospital room?: A Little Help needed climbing 3-5 steps with a railing? : A Little 6 Click Score: 19    End of Session Equipment Utilized During Treatment: Gait belt Activity Tolerance: Patient tolerated treatment well Patient left: with call bell/phone within reach;in bed;with bed alarm set Nurse Communication: Mobility status PT Visit  Diagnosis: Other abnormalities of gait and mobility (R26.89)     Time: 1100-1115 PT Time Calculation (min) (ACUTE ONLY): 15 min  Charges:    $Gait Training: 8-22 mins PT General Charges $$ ACUTE PT VISIT: 1 Visit                     Jaylenn Altier, PT  Acute Rehab Dept [facility]) [phone]  05/18/2023    [facility] 05/18/2023, 11:19 AM

## 2023-05-18 NOTE — TOC Transition Note (Addendum)
Transition of Care [facility]) - CM/SW Discharge Note   Patient Details  Name: Dana Nichols MRN: [id number] Date of Birth: October 21, 1960  [facility]) CM/SW Contact:  Carmina Miller, LCSWA Phone Number: 05/18/2023, 12:29 PM   Clinical Narrative:     CSW spoke with pt concerning HH PT, explained insurance may be a barrier. CSW contacted three [facility] companies, none able to provide services.  CSW spoke with Carma Lair with [employer], she states she is unable to confirm pt's coverage as she has a Engineer, site, she states pt can call her tomorrow and she will be able to confirm coverage, if she is not able to assist she will refer pt out. CSW relayed this information to pt, pt verbalized understanding. Kelsie's contact information placed on AVS. MD and RN made aware.         Patient Goals and CMS Choice      Discharge Placement                         Discharge Plan and Services Additional resources added to the After Visit Summary for                                       Social Determinants of Health (SDOH) Interventions SDOH Screenings   Food Insecurity: No Food Insecurity (05/15/2023)  Housing: Low Risk  (05/15/2023)  Transportation Needs: No Transportation Needs (05/15/2023)  Utilities: Not At Risk (05/15/2023)  Depression (PHQ2-9): Medium Risk (06/27/2022)  Social Connections: Unknown (01/25/2022)   [facility] from [facility]  Tobacco Use: Low Risk  (05/16/2023)     Readmission Risk Interventions     No data to display

## 2023-05-18 NOTE — Progress Notes (Signed)
Subjective: 2 Days Post-Op s/p Procedure(s): CANNULATED HIP PINNING  Patient is alert, oriented.  In good spirits.  Pain reported as mild-moderate last night, slept okay. Walked 200 ft with PT yesterday. Has good home support, wants to DC home when ready.  No other complaints.    Objective:  PE: VITALS:   Vitals:   05/17/23 0616 05/17/23 1422 05/17/23 2215 05/18/23 0652  BP: 137/87 (!) 153/92 (!) 146/91 (!) 153/90  Pulse: (!) 103 81 90 83  Resp: 17 17 18 18   Temp: 98.8 F (37.1 C) 98.2 F (36.8 C) 98.2 F (36.8 C) 98 F (36.7 C)  TempSrc: Oral Oral    SpO2: 94% 98% 99% 99%  Weight:      Height:        AAOx4, sitting comfortably Neurovascular intact Sensation intact distally Intact pulses distally Dorsiflexion/Plantar flexion intact Incision: dressing C/D/I Wiggles toes appropriately   LABS  Results for orders placed or performed during the hospital encounter of 05/15/23 (from the past 24 hour(s))  Renal function panel     Status: Abnormal   Collection Time: 05/18/23  3:15 AM  Result Value Ref Range   Sodium 143 135 - 145 mmol/L   Potassium 4.5 3.5 - 5.1 mmol/L   Chloride 109 98 - 111 mmol/L   CO2 26 22 - 32 mmol/L   Glucose, Bld 115 (H) 70 - 99 mg/dL   BUN 20 8 - 23 mg/dL   Creatinine, Ser 5.62 0.44 - 1.00 mg/dL   Calcium 9.1 8.9 - 13.0 mg/dL   Phosphorus 4.3 2.5 - 4.6 mg/dL   Albumin 3.0 (L) 3.5 - 5.0 g/dL   GFR, Estimated >86 >57 mL/min   Anion gap 8 5 - 15  Magnesium     Status: None   Collection Time: 05/18/23  3:15 AM  Result Value Ref Range   Magnesium 2.3 1.7 - 2.4 mg/dL  CBC with Differential/Platelet     Status: Abnormal   Collection Time: 05/18/23  3:15 AM  Result Value Ref Range   WBC 8.0 4.0 - 10.5 K/uL   RBC 3.95 3.87 - 5.11 MIL/uL   Hemoglobin 11.0 (L) 12.0 - 15.0 g/dL   HCT 84.6 96.2 - 95.2 %   MCV 91.4 80.0 - 100.0 fL   MCH 27.8 26.0 - 34.0 pg   MCHC 30.5 30.0 - 36.0 g/dL   RDW 84.1 32.4 - 40.1 %   Platelets 252 150 - 400  K/uL   nRBC 0.0 0.0 - 0.2 %   Neutrophils Relative % 56 %   Neutro Abs 4.5 1.7 - 7.7 K/uL   Lymphocytes Relative 33 %   Lymphs Abs 2.6 0.7 - 4.0 K/uL   Monocytes Relative 9 %   Monocytes Absolute 0.7 0.1 - 1.0 K/uL   Eosinophils Relative 2 %   Eosinophils Absolute 0.2 0.0 - 0.5 K/uL   Basophils Relative 0 %   Basophils Absolute 0.0 0.0 - 0.1 K/uL   Immature Granulocytes 0 %   Abs Immature Granulocytes 0.02 0.00 - 0.07 K/uL    X-ray: stable post-operative imaging   Assessment/Plan: Left femoral neck fracture  2 Days Post-Op s/p Procedure(s): CANNULATED HIP PINNING - Hbg stable at 11.0 this morning  Post op recs: WB: WBAT Abx: ancef x23 hours post op Imaging: PACU xrays Dressing: keep intact until follow up, change PRN if soiled or saturated. DVT prophylaxis: aspirin 81mg  BID POD1 x4 weeks Follow up: 2 weeks after surgery for a wound  check with Dr. Blanchie Dessert at [facility].  Address: [address], [place], [place] [address]  Office Phone: [phone] Dispo: per medicine, okay to DC from ortho standpoint.  Would like to d/c home as she has ample support. Likely to need HHPT set up prior to discharge if discharging home.  Cecil Cobbs 05/18/2023, 6:54 AM

## 2023-05-18 NOTE — Discharge Summary (Signed)
Physician Discharge Summary  Patient ID: DON RUSCHAK MRN: [id number] DOB/AGE: 1961-04-15 62 y.o.  Admit date: 05/15/2023 Discharge date: 05/18/2023  Admission Diagnoses:  Discharge Diagnoses:  Principal Problem:   Femur fracture (HCC) Active Problems:   Closed left hip fracture (HCC)   Hypertensive urgency   Dyslipidemia   Fibromyalgia   Discharged Condition: stable  Hospital Course: Patient is a 62 year old female past medical history significant for hypertension, fibromyalgia, migraines, chronic tremors, osteoarthritis, IBS and dyslipidemia.  Patient was admitted with left hip pain and inability to bear weight on the left hip.  MRI revealed a nondisplaced femoral neck fracture. Patient underwent pinning of the left hip.  Blood pressure is better controlled.  Patient has been cleared for discharge by the orthopedic team.  Left hip fracture: -S/p cannulated hip pinning (postop day 1)  -Cleared for discharge by the orthopedic team.   Hypertensive urgency: -She has chronic hypertension but BP in the hospital is slightly higher than usual likely secondary acute stress and elevated cortisol -No complaints of headache or chest pain -Continued losartan and verapamil -Blood pressure control improved significantly. -Primary care provider to continue managing blood pressure on discharge.   Chronic pain: -Continue gabapentin and Cymbalta -Continue to optimize.  Consults: orthopedic surgery  Treatments:  CANNULATED HIP PINNING (Left: Hip   Discharge Exam: Blood pressure (!) 123/90, pulse 88, temperature 98 F (36.7 C), temperature source Oral, resp. rate 18, height 5' 5.5" (1.664 m), weight 108 kg, SpO2 100%.   Disposition: Discharge disposition: 06-Home-Health Care Svc       Discharge Instructions     Diet - low sodium heart healthy   Complete by: As directed    Increase activity slowly   Complete by: As directed       Allergies as of 05/18/2023       Reactions    Codeine Rash, Other (See Comments)   Mother told patient she was allergic   Hycodan [hydrocodone Bit-homatrop Mbr] Nausea And Vomiting, Palpitations   Latex Rash   Other Itching   Bandage dressing (Unknown Name)        Medication List     STOP taking these medications    celecoxib 200 MG capsule Commonly known as: CELEBREX   diphenhydrAMINE 25 MG tablet Commonly known as: BENADRYL   Voltaren 1 % Gel Generic drug: diclofenac Sodium       TAKE these medications    aspirin EC 81 MG tablet Take 1 tablet (81 mg total) by mouth 2 (two) times daily for 28 days. Swallow whole.   cyanocobalamin 1000 MCG tablet Commonly known as: VITAMIN B12 Take 1,000 mcg by mouth daily.   DULoxetine 60 MG capsule Commonly known as: CYMBALTA Take 60 mg by mouth 2 (two) times daily.   gabapentin 600 MG tablet Commonly known as: NEURONTIN Take 600 mg by mouth 3 (three) times daily.   losartan 25 MG tablet Commonly known as: COZAAR Take 25 mg by mouth daily.   metaxalone 800 MG tablet Commonly known as: SKELAXIN Take 1 tablet (800 mg total) by mouth 3 (three) times daily as needed for muscle spasms. What changed:  when to take this reasons to take this   multivitamin with minerals tablet Take 1 tablet by mouth daily.   mupirocin ointment 2 % Commonly known as: BACTROBAN Place 1 Application into the nose 2 (two) times daily.   oxyCODONE 5 MG immediate release tablet Commonly known as: Roxicodone Take 1 tablet (5 mg total) by mouth every  4 (four) hours as needed for up to 7 days for severe pain or moderate pain.   rosuvastatin 5 MG tablet Commonly known as: CRESTOR Take 5 mg by mouth every other day.   valACYclovir 1000 MG tablet Commonly known as: VALTREX Take 500 mg by mouth 2 (two) times daily.   Veozah 45 MG Tabs Generic drug: Fezolinetant Take 45 mg by mouth daily.   verapamil 240 MG 24 hr capsule Commonly known as: VERELAN Take 240 mg by mouth daily.    Wegovy 0.25 MG/0.5ML Soaj Generic drug: Semaglutide-Weight Management Inject 0.25 mg into the skin once a week.        Follow-up Information     Joen Laura, MD Follow up in 2 week(s).   Specialty: Orthopedic Surgery Contact information: [address] [place] [address] [phone]         [facility] Follow up.   Why: Please call Kasie to confirm whether or not [facility] can provide home health PT for you. Contact information: Carma Lair- [phone]                Time spent: 35 minutes.  SignedBarnetta Chapel 05/18/2023, 1:18 PM

## 2023-05-20 ENCOUNTER — Encounter (HOSPITAL_COMMUNITY): Payer: Self-pay | Admitting: Orthopedic Surgery

## 2023-05-21 DIAGNOSIS — I10 Essential (primary) hypertension: Secondary | ICD-10-CM | POA: Diagnosis not present

## 2023-05-29 DIAGNOSIS — S72042D Displaced fracture of base of neck of left femur, subsequent encounter for closed fracture with routine healing: Secondary | ICD-10-CM | POA: Diagnosis not present

## 2023-06-26 DIAGNOSIS — S72042D Displaced fracture of base of neck of left femur, subsequent encounter for closed fracture with routine healing: Secondary | ICD-10-CM | POA: Diagnosis not present

## 2023-07-09 DIAGNOSIS — I10 Essential (primary) hypertension: Secondary | ICD-10-CM | POA: Diagnosis not present

## 2023-08-12 DIAGNOSIS — I10 Essential (primary) hypertension: Secondary | ICD-10-CM | POA: Diagnosis not present

## 2023-09-23 DIAGNOSIS — I10 Essential (primary) hypertension: Secondary | ICD-10-CM | POA: Diagnosis not present

## 2023-09-23 DIAGNOSIS — D649 Anemia, unspecified: Secondary | ICD-10-CM | POA: Diagnosis not present

## 2023-09-23 DIAGNOSIS — R7303 Prediabetes: Secondary | ICD-10-CM | POA: Diagnosis not present

## 2023-09-23 DIAGNOSIS — E782 Mixed hyperlipidemia: Secondary | ICD-10-CM | POA: Diagnosis not present

## 2023-09-26 DIAGNOSIS — M797 Fibromyalgia: Secondary | ICD-10-CM | POA: Diagnosis not present

## 2023-09-26 DIAGNOSIS — I10 Essential (primary) hypertension: Secondary | ICD-10-CM | POA: Diagnosis not present

## 2023-09-26 DIAGNOSIS — D649 Anemia, unspecified: Secondary | ICD-10-CM | POA: Diagnosis not present

## 2023-09-26 DIAGNOSIS — E782 Mixed hyperlipidemia: Secondary | ICD-10-CM | POA: Diagnosis not present

## 2023-10-16 ENCOUNTER — Other Ambulatory Visit (HOSPITAL_COMMUNITY): Payer: Self-pay

## 2023-11-18 DIAGNOSIS — M25512 Pain in left shoulder: Secondary | ICD-10-CM | POA: Diagnosis not present

## 2023-11-18 DIAGNOSIS — M25551 Pain in right hip: Secondary | ICD-10-CM | POA: Diagnosis not present

## 2024-03-22 DIAGNOSIS — I10 Essential (primary) hypertension: Secondary | ICD-10-CM | POA: Diagnosis not present

## 2024-03-22 DIAGNOSIS — D649 Anemia, unspecified: Secondary | ICD-10-CM | POA: Diagnosis not present

## 2024-03-22 DIAGNOSIS — R7303 Prediabetes: Secondary | ICD-10-CM | POA: Diagnosis not present

## 2024-03-22 DIAGNOSIS — E782 Mixed hyperlipidemia: Secondary | ICD-10-CM | POA: Diagnosis not present

## 2024-03-22 DIAGNOSIS — Z1159 Encounter for screening for other viral diseases: Secondary | ICD-10-CM | POA: Diagnosis not present

## 2024-03-23 DIAGNOSIS — R7303 Prediabetes: Secondary | ICD-10-CM | POA: Diagnosis not present

## 2024-03-23 DIAGNOSIS — Z23 Encounter for immunization: Secondary | ICD-10-CM | POA: Diagnosis not present

## 2024-03-23 DIAGNOSIS — I10 Essential (primary) hypertension: Secondary | ICD-10-CM | POA: Diagnosis not present

## 2024-03-23 DIAGNOSIS — Z0189 Encounter for other specified special examinations: Secondary | ICD-10-CM | POA: Diagnosis not present

## 2024-03-23 DIAGNOSIS — E782 Mixed hyperlipidemia: Secondary | ICD-10-CM | POA: Diagnosis not present

## 2024-03-23 DIAGNOSIS — D649 Anemia, unspecified: Secondary | ICD-10-CM | POA: Diagnosis not present

## 2024-03-23 DIAGNOSIS — Z0000 Encounter for general adult medical examination without abnormal findings: Secondary | ICD-10-CM | POA: Diagnosis not present

## 2024-04-20 DIAGNOSIS — Z130 Encounter for screening for diseases of the blood and blood-forming organs and certain disorders involving the immune mechanism: Secondary | ICD-10-CM | POA: Diagnosis not present

## 2024-04-20 DIAGNOSIS — Z01419 Encounter for gynecological examination (general) (routine) without abnormal findings: Secondary | ICD-10-CM | POA: Diagnosis not present

## 2024-04-20 DIAGNOSIS — Z1151 Encounter for screening for human papillomavirus (HPV): Secondary | ICD-10-CM | POA: Diagnosis not present

## 2024-04-20 DIAGNOSIS — Z124 Encounter for screening for malignant neoplasm of cervix: Secondary | ICD-10-CM | POA: Diagnosis not present

## 2024-04-20 DIAGNOSIS — Z1231 Encounter for screening mammogram for malignant neoplasm of breast: Secondary | ICD-10-CM | POA: Diagnosis not present

## 2024-04-27 DIAGNOSIS — M25552 Pain in left hip: Secondary | ICD-10-CM | POA: Diagnosis not present

## 2024-04-27 DIAGNOSIS — M545 Low back pain, unspecified: Secondary | ICD-10-CM | POA: Diagnosis not present

## 2024-05-05 ENCOUNTER — Ambulatory Visit: Admitting: Plastic Surgery

## 2024-05-05 VITALS — BP 132/89 | HR 118 | Ht 65.5 in | Wt 225.0 lb

## 2024-05-05 DIAGNOSIS — M542 Cervicalgia: Secondary | ICD-10-CM | POA: Diagnosis not present

## 2024-05-05 DIAGNOSIS — R21 Rash and other nonspecific skin eruption: Secondary | ICD-10-CM

## 2024-05-05 DIAGNOSIS — N62 Hypertrophy of breast: Secondary | ICD-10-CM | POA: Diagnosis not present

## 2024-05-05 DIAGNOSIS — N6489 Other specified disorders of breast: Secondary | ICD-10-CM

## 2024-05-05 DIAGNOSIS — M546 Pain in thoracic spine: Secondary | ICD-10-CM | POA: Diagnosis not present
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
## 2024-05-07 DIAGNOSIS — M48061 Spinal stenosis, lumbar region without neurogenic claudication: Secondary | ICD-10-CM | POA: Diagnosis not present

## 2024-05-28 ENCOUNTER — Telehealth: Payer: Self-pay

## 2024-05-28 NOTE — Telephone Encounter (Signed)
 Faxed records request on 8/20 with confirmed receipt

## 2024-06-03 DIAGNOSIS — M5416 Radiculopathy, lumbar region: Secondary | ICD-10-CM | POA: Diagnosis not present

## 2024-06-03 DIAGNOSIS — M48061 Spinal stenosis, lumbar region without neurogenic claudication: Secondary | ICD-10-CM | POA: Diagnosis not present

## 2024-06-24 DIAGNOSIS — M5416 Radiculopathy, lumbar region: Secondary | ICD-10-CM | POA: Diagnosis not present

## 2024-07-29 DIAGNOSIS — M7062 Trochanteric bursitis, left hip: Secondary | ICD-10-CM | POA: Diagnosis not present

## 2024-07-29 DIAGNOSIS — M5416 Radiculopathy, lumbar region: Secondary | ICD-10-CM | POA: Diagnosis not present

## 2024-07-29 DIAGNOSIS — M4310 Spondylolisthesis, site unspecified: Secondary | ICD-10-CM | POA: Diagnosis not present

## 2024-08-10 DIAGNOSIS — M545 Low back pain, unspecified: Secondary | ICD-10-CM | POA: Diagnosis not present

## 2024-08-24 DIAGNOSIS — M5416 Radiculopathy, lumbar region: Secondary | ICD-10-CM | POA: Diagnosis not present

## 2024-09-02 DIAGNOSIS — M5416 Radiculopathy, lumbar region: Secondary | ICD-10-CM | POA: Diagnosis not present

## 2024-09-14 DIAGNOSIS — M5416 Radiculopathy, lumbar region: Secondary | ICD-10-CM | POA: Diagnosis not present
